# Patient Record
Sex: Male | Born: 1985 | Race: White | Hispanic: No | Marital: Single | Smoking: Former smoker
Health system: Southern US, Community
[De-identification: ages and names within clinical notes are randomized; demographics above are authoritative.]

## PROBLEM LIST (undated history)

## (undated) ENCOUNTER — Ambulatory Visit: Payer: Self-pay

## (undated) DIAGNOSIS — G4730 Sleep apnea, unspecified: Secondary | ICD-10-CM

## (undated) DIAGNOSIS — I10 Essential (primary) hypertension: Secondary | ICD-10-CM

## (undated) DIAGNOSIS — F32A Depression, unspecified: Secondary | ICD-10-CM

## (undated) DIAGNOSIS — J45909 Unspecified asthma, uncomplicated: Secondary | ICD-10-CM

## (undated) HISTORY — DX: Unspecified asthma, uncomplicated: J45.909

## (undated) HISTORY — DX: Sleep apnea, unspecified: G47.30

## (undated) HISTORY — DX: Essential (primary) hypertension: I10

## (undated) HISTORY — PX: FINGER SURGERY: SHX640

---
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
## 2006-09-25 ENCOUNTER — Emergency Department (HOSPITAL_COMMUNITY): Admission: EM | Admit: 2006-09-25 | Discharge: 2006-09-25 | Payer: Self-pay | Admitting: Emergency Medicine

## 2013-06-19 ENCOUNTER — Emergency Department (HOSPITAL_COMMUNITY): Payer: Worker's Compensation

## 2013-06-19 ENCOUNTER — Emergency Department (HOSPITAL_COMMUNITY)
Admission: EM | Admit: 2013-06-19 | Discharge: 2013-06-19 | Disposition: A | Discharge status: Home / Self Care | Payer: Worker's Compensation | Attending: Emergency Medicine | Admitting: Emergency Medicine

## 2013-06-19 ENCOUNTER — Encounter (HOSPITAL_COMMUNITY): Payer: Self-pay | Admitting: Emergency Medicine

## 2013-06-19 DIAGNOSIS — S8990XA Unspecified injury of unspecified lower leg, initial encounter: Secondary | ICD-10-CM | POA: Diagnosis present

## 2013-06-19 DIAGNOSIS — S93409A Sprain of unspecified ligament of unspecified ankle, initial encounter: Secondary | ICD-10-CM | POA: Insufficient documentation

## 2013-06-19 DIAGNOSIS — Y9289 Other specified places as the place of occurrence of the external cause: Secondary | ICD-10-CM | POA: Insufficient documentation

## 2013-06-19 DIAGNOSIS — Y9301 Activity, walking, marching and hiking: Secondary | ICD-10-CM | POA: Insufficient documentation

## 2013-06-19 DIAGNOSIS — Z791 Long term (current) use of non-steroidal anti-inflammatories (NSAID): Secondary | ICD-10-CM | POA: Insufficient documentation

## 2013-06-19 DIAGNOSIS — F17200 Nicotine dependence, unspecified, uncomplicated: Secondary | ICD-10-CM | POA: Diagnosis not present

## 2013-06-19 DIAGNOSIS — S93402A Sprain of unspecified ligament of left ankle, initial encounter: Secondary | ICD-10-CM

## 2013-06-19 DIAGNOSIS — Y990 Civilian activity done for income or pay: Secondary | ICD-10-CM | POA: Diagnosis not present

## 2013-06-19 DIAGNOSIS — X500XXA Overexertion from strenuous movement or load, initial encounter: Secondary | ICD-10-CM | POA: Insufficient documentation

## 2013-06-19 NOTE — Discharge Instructions (Signed)

## 2013-06-19 NOTE — ED Notes (Signed)
Waiting on Ortho Tech to apply ASo ankle brace

## 2013-06-19 NOTE — ED Provider Notes (Signed)
CSN: [id number]     Arrival date & time 06/19/13  0044 History   First MD Initiated Contact with Patient 06/19/13 0101     Chief Complaint  Patient presents with  . Ankle Pain     (Consider location/radiation/quality/duration/timing/severity/associated sxs/prior Treatment) HPI Comments: Patient is an otherwise healthy 28 year old male who presents today after injury to his left ankle. He was walking at work earlier today when he sustained an inversion injury to his left ankle. He states that he was able to catch himself and did not fall to the ground.  No other injuries from the fall. He has never injured this ankle in the past. He has been able to walk on his foot, but only when using only the ball of his foot. He has not been able to take anything for pain, but states he will take aleve when he gets home. He does not want anything for pain currently. No fevers, chills, numbness, weakness.   The history is provided by the patient. No language interpreter was used.    History reviewed. No pertinent past medical history. Past Surgical History  Procedure Laterality Date  . Finger surgery     No family history on file. History  Substance Use Topics  . Smoking status: Current Some Day Smoker  . Smokeless tobacco: Not on file  . Alcohol Use: Yes    Review of Systems  Constitutional: Negative for fever and chills.  Respiratory: Negative for shortness of breath and stridor.   Gastrointestinal: Negative for nausea, vomiting and abdominal pain.  Musculoskeletal: Positive for arthralgias, gait problem and myalgias.  Neurological: Negative for syncope, light-headedness and headaches.  All other systems reviewed and are negative.     Allergies  Review of patient's allergies indicates no known allergies.  Home Medications   Prior to Admission medications   Medication Sig Start Date End Date Taking? Authorizing Provider  naproxen sodium (ANAPROX) 220 MG tablet Take 220 mg by mouth 2  (two) times daily as needed (for pain).   Yes Historical Provider, MD   BP 175/102  Pulse 109  Temp(Src) 98.7 F (37.1 C) (Oral)  Resp 18  Ht 6\' 1"  (1.854 m)  Wt 331 lb (150.141 kg)  BMI 43.68 kg/m2 Physical Exam  Nursing note and vitals reviewed. Constitutional: He is oriented to person, place, and time. He appears well-developed and well-nourished. No distress.  Morbidly obese  HENT:  Head: Normocephalic and atraumatic.  Right Ear: External ear normal.  Left Ear: External ear normal.  Nose: Nose normal.  Eyes: Conjunctivae are normal.  Neck: Normal range of motion. No tracheal deviation present.  Cardiovascular: Normal rate, regular rhythm, normal heart sounds, intact distal pulses and normal pulses.   Pulses:      Dorsalis pedis pulses are 2+ on the right side, and 2+ on the left side.       Posterior tibial pulses are 2+ on the right side, and 2+ on the left side.  Patient is not tachycardic on my exam. Heart rate 90-100.  Pulmonary/Chest: Effort normal and breath sounds normal. No stridor.  Abdominal: Soft. He exhibits no distension. There is no tenderness.  Musculoskeletal: Normal range of motion.  Tender to palpation to medial malleolus. No deformity or bruising. Mild swelling laterally. Sensation intact. Neurovascularly intact. Compartment soft.   Neurological: He is alert and oriented to person, place, and time.  Skin: Skin is warm and dry. He is not diaphoretic.  Psychiatric: He has a normal mood  and affect. His behavior is normal.    ED Course  Procedures (including critical care time) Labs Review Labs Reviewed - No data to display  Imaging Review Dg Ankle Complete Left  06/19/2013   CLINICAL DATA:  Left ankle injury now with left ankle pain and swelling  EXAM: LEFT ANKLE COMPLETE - 3+ VIEW  COMPARISON:  None.  FINDINGS: The ankle joint mortise is preserved. The talar dome is intact. There is no evidence of an acute malleolar fracture. There are Achilles and  plantar calcaneal spurs. The talus and calcaneus appear intact. There is mild diffuse soft tissue swelling.  IMPRESSION: There is no acute bony abnormality of the left ankle. There is soft tissue swelling.   Electronically Signed   By: David  Swazil[place]   On: 06/19/2013 01:57     EKG Interpretation None      MDM   Final diagnoses:  Left ankle sprain    Imaging shows no fracture. Directed pt to ice injury, take acetaminophen or ibuprofen for pain, and to elevate and rest the injury when possible. Patient given crutches for comfort. Return instructions given. Vital signs stable for discharge. Patient / Family / Caregiver informed of clinical course, understand medical decision-making process, and agree with plan.   Mora BellmanHannah S Keiondre Colee, PA-C 06/20/13 1840

## 2013-06-19 NOTE — ED Notes (Signed)
Pt at work, turned a corner and rolled left ankle outward. Pt did not fall down. Pt's left lateral ankle swollen, painful to touch.

## 2013-06-19 NOTE — ED Notes (Signed)
Pt. injured his left ankle while walking at work this evening , " rolled" ankle while turning , ambulatory .

## 2013-06-21 NOTE — ED Provider Notes (Signed)
Medical screening examination/treatment/procedure(s) were performed by non-physician practitioner and as supervising physician I was immediately available for consultation/collaboration.   EKG Interpretation None        Joya Gaskinsonald W Joya Willmott, MD 06/21/13 [id number]

## 2014-12-18 IMAGING — CR DG ANKLE COMPLETE 3+V*L*
3 series · 3 of 3 positions shown · non-contrast
Comparison: None.

CLINICAL DATA: Left ankle injury now with left ankle pain and
swelling

EXAM:
LEFT ANKLE COMPLETE - 3+ VIEW

[t ankle joint ap left]
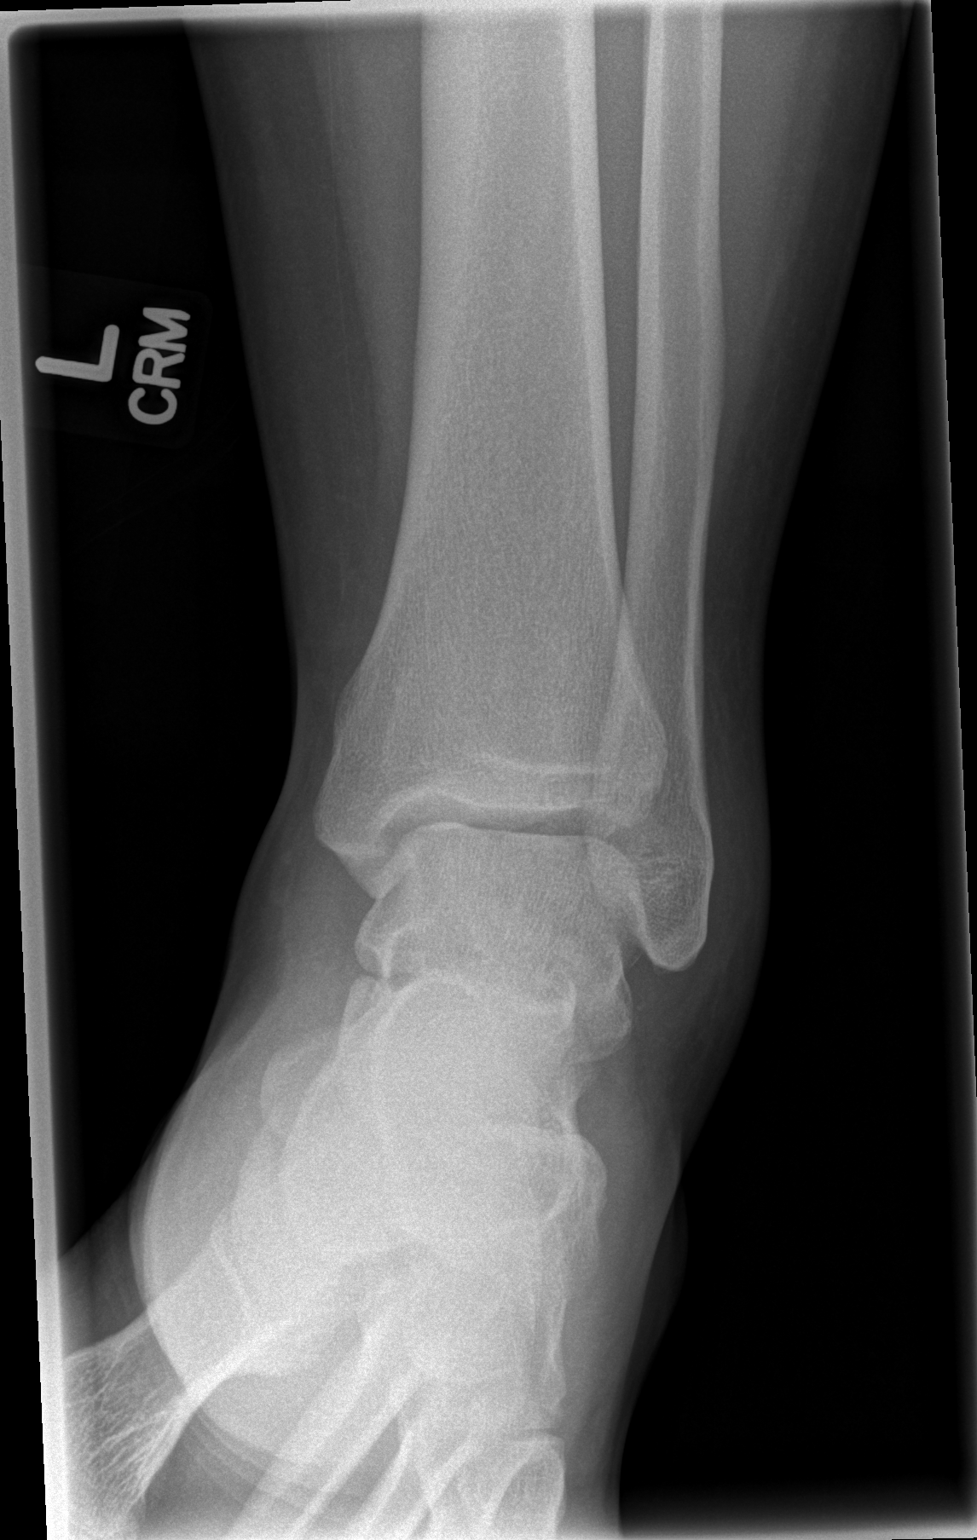

[t ankle joint oblique left *]
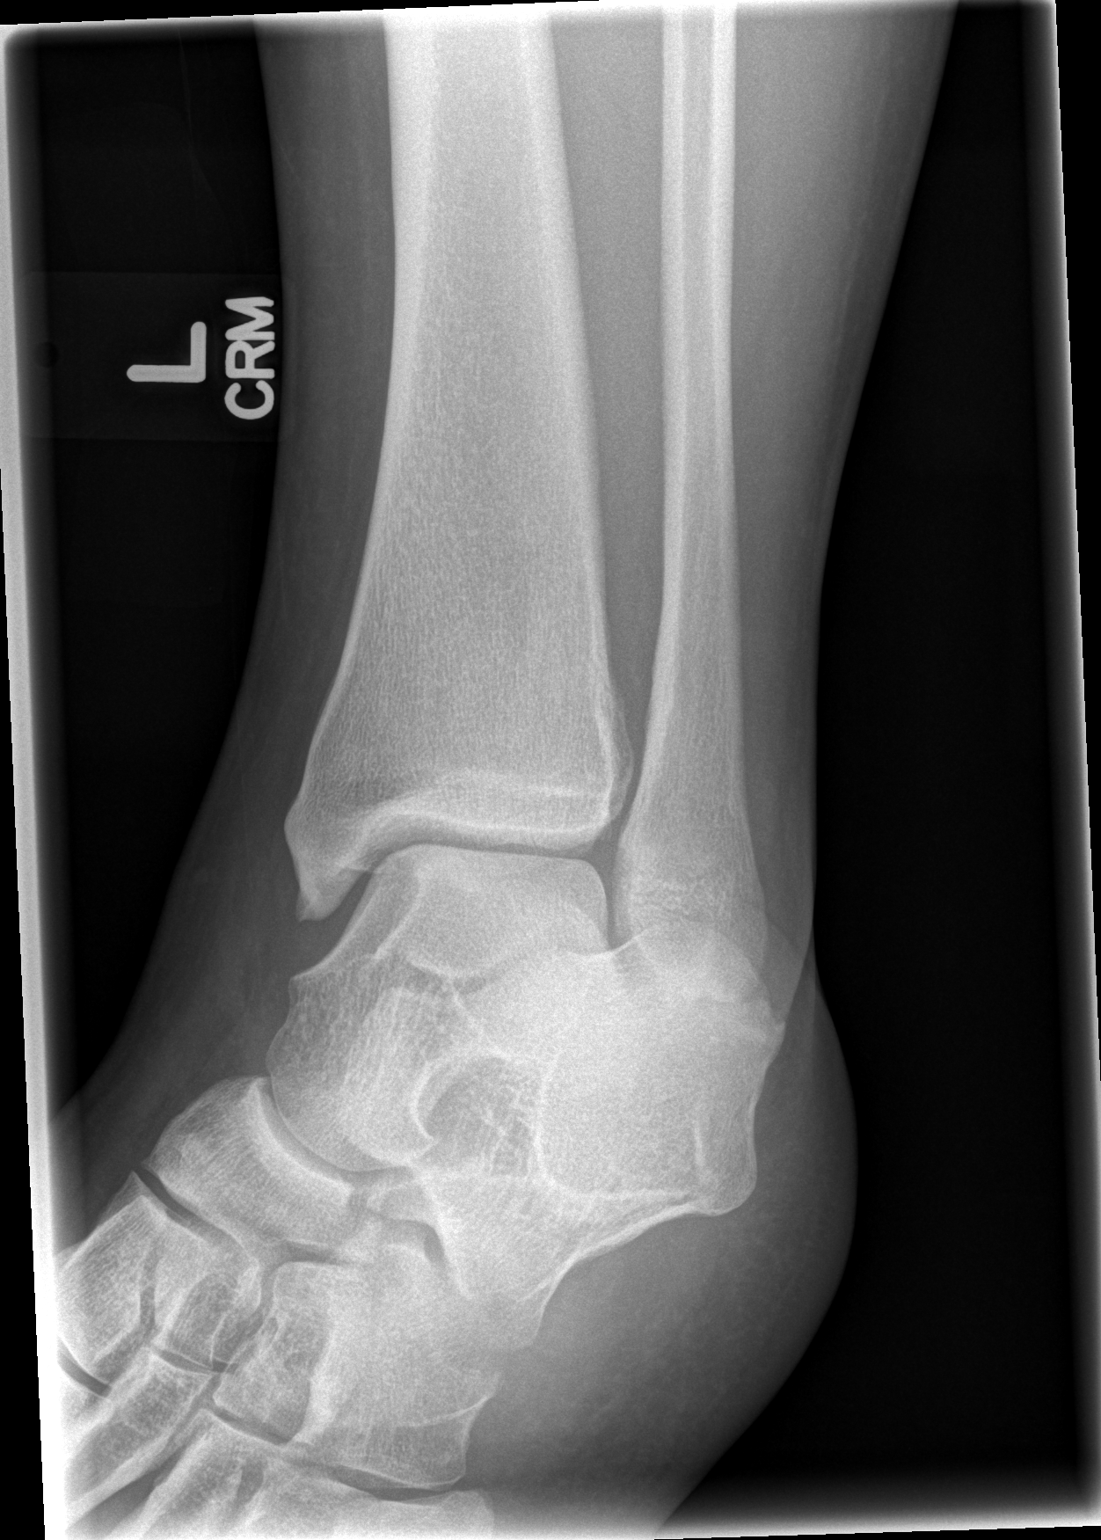

[t ankle joint lat left]
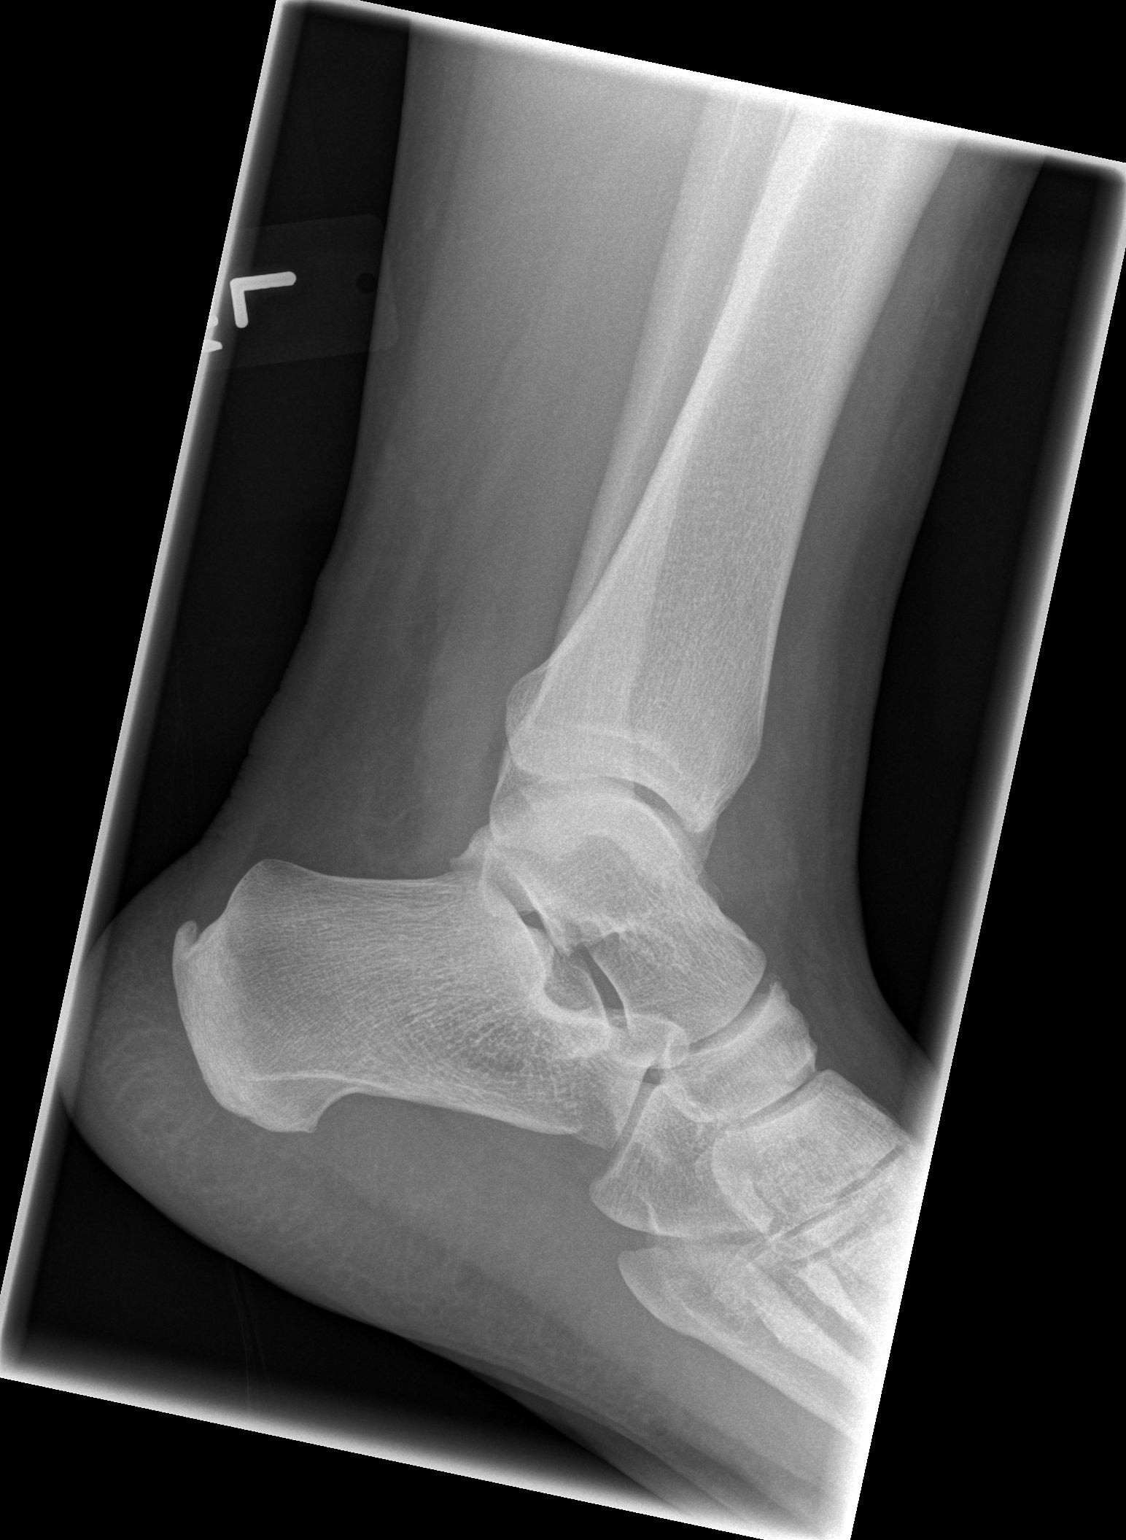

[3 of 3 positions shown; findings below may reference images not displayed]

FINDINGS: The ankle joint mortise is preserved. The talar dome is intact.
There is no evidence of an acute malleolar fracture. There are
Achilles and plantar calcaneal spurs. The talus and calcaneus appear
intact. There is mild diffuse soft tissue swelling.
IMPRESSION: There is no acute bony abnormality of the left ankle. There is soft
tissue swelling.

## 2016-06-28 ENCOUNTER — Ambulatory Visit (INDEPENDENT_AMBULATORY_CARE_PROVIDER_SITE_OTHER): Payer: BLUE CROSS/BLUE SHIELD | Admitting: Urology

## 2016-06-28 DIAGNOSIS — N3944 Nocturnal enuresis: Secondary | ICD-10-CM

## 2016-09-20 ENCOUNTER — Encounter: Payer: Self-pay | Admitting: Pulmonary Disease

## 2016-09-20 ENCOUNTER — Ambulatory Visit (INDEPENDENT_AMBULATORY_CARE_PROVIDER_SITE_OTHER): Payer: BLUE CROSS/BLUE SHIELD | Admitting: Pulmonary Disease

## 2016-09-20 DIAGNOSIS — N3944 Nocturnal enuresis: Secondary | ICD-10-CM | POA: Diagnosis not present

## 2016-09-20 DIAGNOSIS — G4733 Obstructive sleep apnea (adult) (pediatric): Secondary | ICD-10-CM | POA: Insufficient documentation

## 2016-09-20 NOTE — Progress Notes (Signed)
   Subjective:    Patient ID: Stephen Cooper, male    DOB: 08-Jan-1986, [age] y.o.   MRN: [id number]  HPI    Review of Systems  Constitutional: Negative for fever and unexpected weight change.  HENT: Negative for congestion, dental problem, ear pain, nosebleeds, postnasal drip, rhinorrhea, sinus pressure, sneezing, sore throat and trouble swallowing.   Eyes: Negative for redness and itching.  Respiratory: Negative for cough, chest tightness, shortness of breath and wheezing.   Cardiovascular: Negative for palpitations and leg swelling.  Gastrointestinal: Negative for nausea and vomiting.  Genitourinary: Negative for dysuria.  Musculoskeletal: Negative for joint swelling.  Skin: Negative for rash.  Allergic/Immunologic: Negative.  Negative for environmental allergies, food allergies and immunocompromised state.  Neurological: Positive for headaches.  Hematological: Does not bruise/bleed easily.  Psychiatric/Behavioral: Positive for dysphoric mood and sleep disturbance. The patient is nervous/anxious.        Objective:   Physical Exam        Assessment & Plan:

## 2016-09-20 NOTE — Assessment & Plan Note (Signed)
Given excessive daytime somnolence, narrow pharyngeal exam, witnessed apneas & loud snoring, obstructive sleep apnea is very likely & an overnight polysomnogram will be scheduled as a home study. The pathophysiology of obstructive sleep apnea , it's cardiovascular consequences & modes of treatment including CPAP were discused with the patient in detail & they evidenced understanding.  Pretest probability is high 

## 2016-09-20 NOTE — Patient Instructions (Signed)
Home sleep study. Based on this, he may need a CPAP titration study
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
## 2016-09-29 DIAGNOSIS — G4733 Obstructive sleep apnea (adult) (pediatric): Secondary | ICD-10-CM | POA: Diagnosis not present

## 2016-09-30 ENCOUNTER — Other Ambulatory Visit: Payer: Self-pay | Admitting: *Deleted

## 2016-09-30 ENCOUNTER — Telehealth: Payer: Self-pay | Admitting: Pulmonary Disease

## 2016-09-30 DIAGNOSIS — N3944 Nocturnal enuresis: Secondary | ICD-10-CM

## 2016-09-30 DIAGNOSIS — G4733 Obstructive sleep apnea (adult) (pediatric): Secondary | ICD-10-CM

## 2016-09-30 NOTE — Telephone Encounter (Signed)
Per RA, HST showed moderate OSA with 27 events per hour.   Recommends a CPAP titration study as the next step.

## 2016-10-04 NOTE — Telephone Encounter (Signed)
Spoke with pt letting him know the results from his HST and that RA recommended him having a CPAP titration study. Pt expressed understanding. Placed an order for him to have a CPAP titration study performed. Nothing further needed at this time.

## 2016-10-11 ENCOUNTER — Ambulatory Visit (INDEPENDENT_AMBULATORY_CARE_PROVIDER_SITE_OTHER): Payer: BLUE CROSS/BLUE SHIELD | Admitting: Urology

## 2016-10-11 DIAGNOSIS — R0683 Snoring: Secondary | ICD-10-CM | POA: Diagnosis not present

## 2016-10-11 DIAGNOSIS — N3944 Nocturnal enuresis: Secondary | ICD-10-CM

## 2016-11-01 ENCOUNTER — Ambulatory Visit: Payer: BLUE CROSS/BLUE SHIELD | Attending: Pulmonary Disease | Admitting: Pulmonary Disease

## 2016-11-01 ENCOUNTER — Encounter (INDEPENDENT_AMBULATORY_CARE_PROVIDER_SITE_OTHER): Payer: Self-pay

## 2016-11-01 DIAGNOSIS — G4733 Obstructive sleep apnea (adult) (pediatric): Secondary | ICD-10-CM | POA: Diagnosis present

## 2016-11-08 ENCOUNTER — Telehealth: Payer: Self-pay | Admitting: Pulmonary Disease

## 2016-11-08 DIAGNOSIS — G4733 Obstructive sleep apnea (adult) (pediatric): Secondary | ICD-10-CM | POA: Diagnosis not present

## 2016-11-08 NOTE — Telephone Encounter (Signed)
Pl elt pt know Rx to DME for CPAP  11 cm H2O with a Small size Resmed Nasal Mask Airfit N20 mask and heated humidification. DL in 4 wks  OV in 6 wks

## 2016-11-08 NOTE — Procedures (Signed)
Patient Name: Stephen Cooper, Stephen Cooper Date: 11/01/2016 Gender: Male D.O.B: 20-Apr-1985 Age (years): 30 Referring Provider: Cyril Mourning MD, ABSM Height (inches): 73 Interpreting Physician: Cyril Mourning MD, ABSM Weight (lbs): 382 RPSGT: Alfonso Ellis BMI: 50 MRN: [id number] Neck Size: 21.50   CLINICAL INFORMATION The patient is referred for a CPAP titration to treat sleep apnea.  Date of HST: 09/2016, AHI 27/h  SLEEP STUDY TECHNIQUE As per the AASM Manual for the Scoring of Sleep and Associated Events v2.3 (April 2016) with a hypopnea requiring 4% desaturations.  The channels recorded and monitored were frontal, central and occipital EEG, electrooculogram (EOG), submentalis EMG (chin), nasal and oral airflow, thoracic and abdominal wall motion, anterior tibialis EMG, snore microphone, electrocardiogram, and pulse oximetry. Continuous positive airway pressure (CPAP) was initiated at the beginning of the study and titrated to treat sleep-disordered breathing.  MEDICATIONS Medications self-administered by patient taken the night of the study : DESMOPRESSIN  TECHNICIAN COMMENTS Comments added by technician: Patient tolerated CPAP very well. CPAP titration started at 5 cm of H20 and increased to 11 cm of H20 due to events and snoring. No bathroom trips noted. Patient had some difficulities initiating sleep. Optimal pressure seemed to be 11 cm of H20   RESPIRATORY PARAMETERS Optimal PAP Pressure (cm): 11 AHI at Optimal Pressure (/hr): 0.0 Overall Minimal O2 (%): 82.00 Supine % at Optimal Pressure (%): 94 Minimal O2 at Optimal Pressure (%): 90.0     SLEEP ARCHITECTURE The study was initiated at 11:07:33 PM and ended at 5:39:07 AM.  Sleep onset time was 61.2 minutes and the sleep efficiency was 81.3%. The total sleep time was 318.3 minutes.  The patient spent 0.16% of the night in stage N1 sleep, 24.60% in stage N2 sleep, 49.48% in stage N3 and 25.76% in REM.Stage REM latency was 48.0  minutes  Wake after sleep onset was 12.0. Alpha intrusion was absent. Supine sleep was 59.45%.  CARDIAC DATA The 2 lead EKG demonstrated sinus rhythm. The mean heart rate was N/A beats per minute. Other EKG findings include: None.   LEG MOVEMENT DATA The total Periodic Limb Movements of Sleep (PLMS) were 0. The PLMS index was 0.00. A PLMS index of <15 is considered normal in adults.  IMPRESSIONS - The optimal PAP pressure was 11 cm of water. - Central sleep apnea was not noted during this titration (CAI = 0.0/h). - Moderate oxygen desaturations were observed during this titration (min O2 = 82.00%). - The patient snored with Moderate snoring volume during this titration study. - No cardiac abnormalities were observed during this study. - Clinically significant periodic limb movements were not noted during this study. Arousals associated with PLMs were rare.   DIAGNOSIS - Obstructive Sleep Apnea (327.23 [G47.33 ICD-10])   RECOMMENDATIONS - Trial of CPAP therapy on 11 cm H2O with a Small size Resmed Nasal Mask Airfit N20 mask and heated humidification. - Avoid alcohol, sedatives and other CNS depressants that may worsen sleep apnea and disrupt normal sleep architecture. - Sleep hygiene should be reviewed to assess factors that may improve sleep quality. - Weight management and regular exercise should be initiated or continued. - Return to Sleep Center for re-evaluation after 4 weeks of therapy    Cyril Mourning MD Board Certified in Sleep medicine

## 2016-11-10 NOTE — Telephone Encounter (Signed)
Spoke with patient. He is aware of the CPAP order. Will go ahead and place today. Nothing further needed at time of call.

## 2019-10-01 ENCOUNTER — Encounter: Payer: Self-pay | Admitting: Emergency Medicine

## 2019-10-01 ENCOUNTER — Ambulatory Visit
Admission: EM | Admit: 2019-10-01 | Discharge: 2019-10-01 | Disposition: A | Discharge status: Home / Self Care | Payer: BC Managed Care – PPO | Attending: Emergency Medicine | Admitting: Emergency Medicine

## 2019-10-01 DIAGNOSIS — M545 Low back pain, unspecified: Secondary | ICD-10-CM

## 2019-10-01 MED ORDER — CYCLOBENZAPRINE HCL 5 MG PO TABS: 5.0000 mg | ORAL_TABLET | Freq: Three times a day (TID) | ORAL | 0 refills | Status: DC | PRN

## 2019-10-01 MED ORDER — DEXAMETHASONE SODIUM PHOSPHATE 10 MG/ML IJ SOLN
10.0000 mg | Freq: Once | INTRAMUSCULAR | Status: AC
  Administered 2019-10-01: 10 mg via INTRAMUSCULAR

## 2019-10-01 MED ORDER — ACETAMINOPHEN 500 MG PO TABS: 500.0000 mg | ORAL_TABLET | Freq: Four times a day (QID) | ORAL | 0 refills | Status: DC | PRN

## 2019-10-01 MED ORDER — PREDNISONE 10 MG (21) PO TBPK: ORAL_TABLET | ORAL | 0 refills | Status: DC

## 2019-10-01 MED ORDER — KETOROLAC TROMETHAMINE 30 MG/ML IJ SOLN
30.0000 mg | Freq: Once | INTRAMUSCULAR | Status: AC
  Administered 2019-10-01: 30 mg via INTRAMUSCULAR

## 2019-10-01 NOTE — Discharge Instructions (Addendum)
Rest, ice and heat as needed Ensure adequate ROM as tolerated. Prescribed Tylenol as needed for  pain relief Prescribed flexeril  for muscle spasm.  Do not drive or operate heavy machinery while taking this medication Prescribed prednisone Return here or go to ER if you have any new or worsening symptoms such as numbness/tingling of the inner thighs, loss of bladder or bowel control, headache/blurry vision, nausea/vomiting, confusion/altered mental status, dizziness, weakness, passing out, imbalance, etc..Marland Kitchen

## 2019-10-01 NOTE — ED Provider Notes (Signed)
[facility]   [id number] 10/01/19 Arrival Time: 0809   Chief Complaint  Patient presents with  . Back Pain     SUBJECTIVE: History from: patient.  Stephen Cooper is a 34 y.o. male who presented to the urgent care with a complaint of low back pain for the past 2 days.  Denies any precipitating event, injury or trauma.  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  He localizes the pain to the right low back.  He describes the pain as constant and achy.  Has not tried any OTC medication.  His symptoms are made worse with ROM.  He denies similar symptoms in the past.  Denies chills, fever, nausea, vomiting, diarrhea, paresthesia, diplopia, blurry vision, LOC.   ROS: As per HPI.  All other pertinent ROS negative.     Past Medical History:  Diagnosis Date  . 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 Hypertension   . Sleep apnea    Past Surgical History:  Procedure Laterality Date  . FINGER SURGERY     No Known Allergies No current facility-administered medications on file prior to encounter.   Current Outpatient Medications on File Prior to Encounter  Medication Sig Dispense Refill  . desmopressin (DDAVP) 0.2 MG tablet Take 0.2 mg by mouth daily. Pt takes 4 tabs daily.    . naproxen sodium (ANAPROX) 220 MG tablet Take 220 mg by mouth 2 (two) times daily as needed (for pain).     Social History   Socioeconomic History  . Marital status: Single    Spouse name: Not on file  . Number of children: Not on file  . Years of education: Not on file  . Highest education level: Not on file  Occupational History  . Not on file  Tobacco Use  . Smoking status: Former Games developer  . Smokeless tobacco: Current User    Types: Snuff  . Tobacco comment: pt smokes Hookah once a week  Vaping Use  . Vaping Use: Never used  Substance and Sexual Activity  . Alcohol use: Not Currently    Comment: occasional liquor  . Drug use: No  . Sexual activity: Not on  file  Other Topics Concern  . Not on file  Social History Narrative  . Not on file   Social Determinants of Health   Financial Resource Strain:   . Difficulty of Paying Living Expenses:   Food Insecurity:   . Worried About Programme researcher, broadcasting/film/video in the Last Year:   . Barista in the Last Year:   Transportation Needs:   . Freight forwarder (Medical):   Marland Kitchen Lack of Transportation (Non-Medical):   Physical Activity:   . Days of Exercise per Week:   . Minutes of Exercise per Session:   Stress:   . Feeling of Stress :   Social Connections:   . Frequency of Communication with Friends and Family:   . Frequency of Social Gatherings with Friends and Family:   . Attends Religious Services:   . Active Member of Clubs or Organizations:   . Attends Banker Meetings:   Marland Kitchen Marital Status:   Intimate Partner Violence:   . Fear of Current or Ex-Partner:   . Emotionally Abused:   Marland Kitchen Physically Abused:   . Sexually Abused:    No family history on file.  OBJECTIVE:  Vitals:   10/01/19 0822 10/01/19 0825  BP:  Marland Kitchen)  155/103  Pulse:  88  Resp:  19  Temp:  98.4 F (36.9 C)  TempSrc:  Oral  SpO2:  96%  Weight: (!) 388 lb 0.2 oz (176 kg)   Height: 6\' 1"  (1.854 m)      Physical Exam Vitals and nursing note reviewed.  Constitutional:      General: He is not in acute distress.    Appearance: Normal appearance. He is normal weight. He is not ill-appearing, toxic-appearing or diaphoretic.  Cardiovascular:     Rate and Rhythm: Normal rate and regular rhythm.     Pulses: Normal pulses.     Heart sounds: Normal heart sounds. No murmur heard.  No friction rub. No gallop.   Pulmonary:     Effort: Pulmonary effort is normal. No respiratory distress.     Breath sounds: Normal breath sounds. No stridor. No wheezing, rhonchi or rales.  Chest:     Chest wall: No tenderness.  Musculoskeletal:     Cervical back: Normal.     Thoracic back: Normal.     Lumbar back: Spasms and  tenderness present.     Comments: Back:  Patient ambulates from chair to exam table without difficulty.  Inspection: Skin clear and intact without obvious swelling, erythema, or ecchymosis. Warm to the touch  Palpation: Vertebral processes nontender. Tenderness about the lower left paravertebral muscles  ROM: FROM Strength: 5/5 hip flexion, 5/5 knee extension, 5/5 knee flexion, 5/5 plantar flexion, 5/5 dorsiflexion   Special Tests: Negative Straight leg raise  Neurological:     General: No focal deficit present.     Mental Status: He is alert and oriented to person, place, and time.     GCS: GCS eye subscore is 4. GCS verbal subscore is 5. GCS motor subscore is 6.     Cranial Nerves: Cranial nerves are intact.     Sensory: Sensation is intact.     Motor: Motor function is intact.     Coordination: Coordination is intact.     Gait: Gait is intact.     LABS:  No results found for this or any previous visit (from the past 24 hour(s)).   ASSESSMENT & PLAN:  1. Acute right-sided low back pain without sciatica     Meds ordered this encounter  Medications  . dexamethasone (DECADRON) injection 10 mg  . ketorolac (TORADOL) 30 MG/ML injection 30 mg  . predniSONE (STERAPRED UNI-PAK 21 TAB) 10 MG (21) TBPK tablet    Sig: Take 6 tabs by mouth daily  for 1 days, then 5 tabs for 1 days, then 4 tabs for 1 days, then 3 tabs for 1 days, 2 tabs for 1 days, then 1 tab by mouth daily for 1 days    Dispense:  21 tablet    Refill:  0  . cyclobenzaprine (FLEXERIL) 5 MG tablet    Sig: Take 1 tablet (5 mg total) by mouth 3 (three) times daily as needed.    Dispense:  30 tablet    Refill:  0  . acetaminophen (TYLENOL) 500 MG tablet    Sig: Take 1 tablet (500 mg total) by mouth every 6 (six) hours as needed.    Dispense:  30 tablet    Refill:  0   Discharge Instructions Rest, ice and heat as needed Ensure adequate ROM as tolerated. Prescribed Tylenol as needed for  pain relief Prescribed  flexeril  for muscle spasm.  Do not drive or operate heavy machinery while taking this medication Prescribed  prednisone Return here or go to ER if you have any new or worsening symptoms such as numbness/tingling of the inner thighs, loss of bladder or bowel control, headache/blurry vision, nausea/vomiting, confusion/altered mental status, dizziness, weakness, passing out, imbalance, etc...    Reviewed expectations re: course of current medical issues. Questions answered. Outlined signs and symptoms indicating need for more acute intervention. Patient verbalized understanding. After Visit Summary given.      Note: This document was prepared using Dragon voice recognition software and may include unintentional dictation errors.    Durward Parcel, FNP 10/01/19 [phone]

## 2019-10-01 NOTE — ED Triage Notes (Signed)
Low back pain with movement x 2 days.  Denies any injury.  Pt states he has had to call out of work.

## 2021-03-30 ENCOUNTER — Ambulatory Visit
Admission: EM | Admit: 2021-03-30 | Discharge: 2021-03-30 | Disposition: A | Discharge status: Home / Self Care | Payer: BC Managed Care – PPO | Attending: Family Medicine | Admitting: Family Medicine

## 2021-03-30 ENCOUNTER — Other Ambulatory Visit: Payer: Self-pay

## 2021-03-30 DIAGNOSIS — R509 Fever, unspecified: Secondary | ICD-10-CM

## 2021-03-30 DIAGNOSIS — R6883 Chills (without fever): Secondary | ICD-10-CM

## 2021-03-30 DIAGNOSIS — J069 Acute upper respiratory infection, unspecified: Secondary | ICD-10-CM

## 2021-03-30 MED ORDER — OSELTAMIVIR PHOSPHATE 75 MG PO CAPS: 75.0000 mg | ORAL_CAPSULE | Freq: Two times a day (BID) | ORAL | 0 refills | Status: DC

## 2021-03-30 MED ORDER — PROMETHAZINE-DM 6.25-15 MG/5ML PO SYRP: 5.0000 mL | ORAL_SOLUTION | Freq: Four times a day (QID) | ORAL | 0 refills | Status: DC | PRN

## 2021-03-30 NOTE — ED Triage Notes (Signed)
Pt tested + for covid two weeks ago. He took a covid tested two days ago and it was negative.

## 2021-03-30 NOTE — ED Triage Notes (Signed)
Pt presents to the office for fatigue,headache and body ache x 2 days.

## 2021-03-30 NOTE — ED Provider Notes (Signed)
[facility]    CSN: [id number] Arrival date & time: 03/30/21  0947      History   Chief Complaint Chief Complaint  Patient presents with   Fatigue   Headache    Body aches x 3 dyas     HPI Stephen Cooper is a 36 y.o. male.   Presenting today with 2-day history of fatigue, headaches, body aches, chills, sweats, cough, congestion, sore throat, abdominal pain and 1 episode of nausea and vomiting that has now resolved.  Denies chest pain, shortness of breath, dizziness, intolerance to p.o.  So far taking Mucinex, over-the-counter cold and congestion medications with no relief.  Recently resolved from COVID 2 weeks ago.  History of childhood asthma, has not had an inhaler at home for years per patient.   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 Authorizing Provider  oseltamivir (TAMIFLU) 75 MG capsule Take 1 capsule (75 mg total) by mouth every 12 (twelve) hours. 03/30/21  Yes Particia Nearing, PA-C  promethazine-dextromethorphan (PROMETHAZINE-DM) 6.25-15 MG/5ML syrup Take 5 mLs by mouth 4 (four) times daily as needed. 03/30/21  Yes Particia Nearing, PA-C  acetaminophen (TYLENOL) 500 MG tablet Take 1 tablet (500 mg total) by mouth every 6 (six) hours as needed. 10/01/19   Avegno, Zachery Dakins, FNP  cyclobenzaprine (FLEXERIL) 5 MG tablet Take 1 tablet (5 mg total) by mouth 3 (three) times daily as needed. 10/01/19   Avegno, Zachery Dakins, FNP  desmopressin (DDAVP) 0.2 MG tablet Take 0.2 mg by mouth daily. Pt takes 4 tabs daily.    [provider]  naproxen sodium  (ANAPROX) 220 MG tablet Take 220 mg by mouth 2 (two) times daily as needed (for pain).    [provider]  predniSONE (STERAPRED UNI-PAK 21 TAB) 10 MG (21) TBPK tablet Take 6 tabs by mouth daily  for 1 days, then 5 tabs for 1 days, then 4 tabs for 1 days, then 3 tabs for 1 days, 2 tabs for 1 days, then 1 tab by mouth daily for 1 days 10/01/19   Durward Parcel, FNP    Family History History reviewed. No pertinent family history.  Social History Social History   Tobacco Use   Smoking status: Former   Smokeless tobacco: Current    Types: Snuff   Tobacco comments:    pt smokes Hookah once a week  Vaping Use   Vaping Use: Never used  Substance Use Topics   Alcohol use: Not Currently    Comment: occasional liquor   Drug use: No     Allergies   Patient has no known allergies.   Review of Systems Review of Systems Per HPI  Physical Exam Triage Vital Signs ED Triage Vitals [03/30/21 1008]  Enc Vitals Group     BP (!) 135/91     Pulse Rate 91     Resp 16     Temp 98.7 F (37.1 C)     Temp Source Oral  SpO2 100 %     Weight      Height      Head Circumference      Peak Flow      Pain Score      Pain Loc      Pain Edu?      Excl. in GC?    No data found.  Updated Vital Signs BP (!) 135/91 (BP Location: Left Arm)    Pulse 91    Temp 98.7 F (37.1 C) (Oral)    Resp 16    SpO2 100%   Visual Acuity Right Eye Distance:   Left Eye Distance:   Bilateral Distance:    Right Eye Near:   Left Eye Near:    Bilateral Near:     Physical Exam Vitals and nursing note reviewed.  Constitutional:      General: He is not in acute distress.    Appearance: He is well-developed. He is diaphoretic.  HENT:     Head: Atraumatic.     Right Ear: Tympanic membrane and external ear normal.     Left Ear: Tympanic membrane and external ear normal.     Nose: Rhinorrhea present.     Mouth/Throat:     Pharynx: Posterior oropharyngeal erythema present. No oropharyngeal  exudate.  Eyes:     Conjunctiva/sclera: Conjunctivae normal.     Pupils: Pupils are equal, round, and reactive to light.  Cardiovascular:     Rate and Rhythm: Normal rate and regular rhythm.     Heart sounds: Normal heart sounds.  Pulmonary:     Effort: Pulmonary effort is normal. No respiratory distress.     Breath sounds: No wheezing or rales.  Abdominal:     General: Bowel sounds are normal. There is no distension.     Palpations: Abdomen is soft.     Tenderness: There is no abdominal tenderness. There is no guarding.  Musculoskeletal:        General: Normal range of motion.     Cervical back: Normal range of motion and neck supple.  Lymphadenopathy:     Cervical: No cervical adenopathy.  Skin:    General: Skin is warm.  Neurological:     Mental Status: He is alert and oriented to person, place, and time.  Psychiatric:        Behavior: Behavior normal.     UC Treatments / Results  Labs (all labs ordered are listed, but only abnormal results are displayed) Labs Reviewed  COVID-19, FLU A+B NAA    EKG   Radiology No results found.  Procedures Procedures (including critical care time)  Medications Ordered in UC Medications - No data to display  Initial Impression / Assessment and Plan / UC Course  I have reviewed the triage vital signs and the nursing notes.  Pertinent labs & imaging results that were available during my care of the patient were reviewed by me and considered in my medical decision making (see chart for details).     Strongly suspect influenza causing his symptoms, COVID and flu testing pending, will start Tamiflu proactively while awaiting results and Phenergan DM for symptomatic benefit.  Discussed over-the-counter supportive medications and home care additionally.  Strict return precautions given for any acutely worsening symptoms.  Final Clinical Impressions(s) / UC Diagnoses   Final diagnoses:  Viral URI with cough  Fever, unspecified   Chills   Discharge Instructions   None    ED Prescriptions     Medication Sig Dispense  Auth. Provider   oseltamivir (TAMIFLU) 75 MG capsule Take 1 capsule (75 mg total) by mouth every 12 (twelve) hours. 10 capsule Particia Nearing, [place]   promethazine-dextromethorphan (PROMETHAZINE-DM) 6.25-15 MG/5ML syrup Take 5 mLs by mouth 4 (four) times daily as needed. 100 mL Particia Nearing, [place]      PDMP not reviewed this encounter.   Particia Nearing, [place] 03/30/21 1114

## 2021-03-31 LAB — COVID-19, FLU A+B NAA
Influenza A, NAA: DETECTED — AB
Influenza B, NAA: NOT DETECTED
SARS-CoV-2, NAA: NOT DETECTED

## 2021-04-01 ENCOUNTER — Telehealth (HOSPITAL_COMMUNITY): Payer: Self-pay | Admitting: Emergency Medicine

## 2021-04-01 MED ORDER — ALBUTEROL SULFATE HFA 108 (90 BASE) MCG/ACT IN AERS: 2.0000 | INHALATION_SPRAY | RESPIRATORY_TRACT | 0 refills | Status: DC | PRN

## 2023-03-27 ENCOUNTER — Ambulatory Visit
Admission: EM | Admit: 2023-03-27 | Discharge: 2023-03-27 | Disposition: A | Discharge status: Home / Self Care | Payer: Self-pay | Attending: Physician Assistant | Admitting: Physician Assistant

## 2023-03-27 DIAGNOSIS — F430 Acute stress reaction: Secondary | ICD-10-CM

## 2023-03-27 DIAGNOSIS — G4700 Insomnia, unspecified: Secondary | ICD-10-CM

## 2023-03-27 MED ORDER — HYDROXYZINE HCL 25 MG PO TABS: 25.0000 mg | ORAL_TABLET | Freq: Three times a day (TID) | ORAL | 0 refills | Status: DC | PRN

## 2023-03-27 NOTE — ED Provider Notes (Signed)
[facility]    CSN: [id number] Arrival date & time: 03/27/23  1337      History   Chief Complaint No chief complaint on file.   HPI Stephen Cooper is a 38 y.o. male.   Patient presents today with a prolonged history of situational stress and anxiety that has worsened in the past several days.  He reports that overnight he was unable to sleep because he had trouble controlling his anxiety and thoughts.  He has never seen a mental health provider and denies formal diagnosis of depression or anxiety.  He reports many situational stressors including stressors at work as well as at home.  He has never been hospitalized for mental health in the past and has never tried medication.  He does not turn on night but did take melatonin and this was ineffective.  He reports that physically he is feeling very weak as a result of the lack of sleep and is hoping to use the medication to help him get some rest.  He has had passive suicidal ideation in the past but denies any previous suicide attempt.  He does not currently have any thoughts of suicide or harming anyone else but does often just feel like he is better off not being here.  He has had panic attacks in the past that are generally triggered by dread but has not had any of these episodes recently.  He does not have any access to weapons and reports a very strong support network including a significant other with mental health issues who is very supportive.      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 Authorizing Provider  hydrOXYzine  (ATARAX) 25 MG tablet Take 1 tablet (25 mg total) by mouth every 8 (eight) hours as needed. 03/27/23  Yes Kendry Pfarr, Noberto Retort, PA-C  acetaminophen (TYLENOL) 500 MG tablet Take 1 tablet (500 mg total) by mouth every 6 (six) hours as needed. 10/01/19   Avegno, Zachery Dakins, FNP  albuterol (VENTOLIN HFA) 108 (90 Base) MCG/ACT inhaler Inhale 2 puffs into the lungs every 4 (four) hours as needed for wheezing or shortness of breath. 04/01/21   Merrilee Jansky, MD  desmopressin (DDAVP) 0.2 MG tablet Take 0.2 mg by mouth Cooper. Pt takes 4 tabs Cooper.    [provider]    Family History History reviewed. No pertinent family history.  Social History Social History   Tobacco Use   Smoking status: Former   Smokeless tobacco: Current    Types: Snuff   Tobacco comments:    pt smokes Hookah once a week  Vaping Use   Vaping status: Never Used  Substance Use Topics   Alcohol use: Not Currently    Comment: occasional liquor   Drug use: No     Allergies   Patient has no known allergies.   Review of Systems Review of Systems  Constitutional:  Negative for activity change, appetite change, fatigue and fever.  Respiratory:  Negative for shortness  of breath.   Cardiovascular:  Negative for chest pain and palpitations.  Psychiatric/Behavioral:  Positive for sleep disturbance. Negative for agitation, confusion, dysphoric mood, hallucinations, self-injury and suicidal ideas (history of passive). The patient is nervous/anxious. The patient is not hyperactive.      Physical Exam Triage Vital Signs ED Triage Vitals  Encounter Vitals Group     BP 03/27/23 1518 (!) 145/101     Systolic BP Percentile --      Diastolic BP Percentile --      Pulse Rate 03/27/23 1518 68     Resp 03/27/23 1518 18     Temp 03/27/23 1518 98.7 F (37.1 C)     Temp Source 03/27/23 1518 Oral     SpO2 03/27/23 1518 96 %     Weight --      Height --      Head Circumference --      Peak Flow --      Pain Score 03/27/23  1520 0     Pain Loc --      Pain Education --      Exclude from Growth Chart --    No data found.  Updated Vital Signs BP (!) 145/101 (BP Location: Right Arm)   Pulse 68   Temp 98.7 F (37.1 C) (Oral)   Resp 18   SpO2 96%   Visual Acuity Right Eye Distance:   Left Eye Distance:   Bilateral Distance:    Right Eye Near:   Left Eye Near:    Bilateral Near:     Physical Exam Vitals reviewed.  Constitutional:      General: He is awake.     Appearance: Normal appearance. He is well-developed. He is not ill-appearing.     Comments: Very pleasant male appears stated age in no acute distress sitting comfortably in exam room  HENT:     Head: Normocephalic and atraumatic.     Mouth/Throat:     Pharynx: No oropharyngeal exudate, posterior oropharyngeal erythema or uvula swelling.  Cardiovascular:     Rate and Rhythm: Normal rate and regular rhythm.     Heart sounds: Normal heart sounds, S1 normal and S2 normal. No murmur heard. Pulmonary:     Effort: Pulmonary effort is normal.     Breath sounds: Normal breath sounds. No stridor. No wheezing, rhonchi or rales.     Comments: Clear to auscultation bilaterally Neurological:     Mental Status: He is alert.  Psychiatric:        Attention and Perception: Attention normal.        Mood and Affect: Mood is depressed. Mood is not anxious. Affect is tearful.        Speech: Speech normal.        Behavior: Behavior is cooperative.        Thought Content: Thought content does not include homicidal or suicidal ideation. Thought content does not include homicidal or suicidal plan.     Comments: Avoids eye contact.  Tearful at times.  Normal appearance/hygiene.  Appropriate speech.      UC Treatments / Results  Labs (all labs ordered are listed, but only abnormal results are displayed) Labs Reviewed - No data to display  EKG   Radiology No results found.  Procedures Procedures (including critical care time)  Medications Ordered  in UC Medications - No data to display  Initial Impression / Assessment and Plan / UC Course  I have reviewed the triage vital signs and the  nursing notes.  Pertinent labs & imaging results that were available during my care of the patient were reviewed by me and considered in my medical decision making (see chart for details).     Patient is well-appearing, afebrile, nontoxic, nontachycardic.  He denies any current suicidal or homicidal ideation and contracts for safety today.  We discussed that unfortunately, we are limited in urgent care what we can initiate in terms of mental health medications.  Will try hydroxyzine to help him with acute anxiety and insomnia.  We discussed that this is sedating and he should not drive or drink alcohol while taking it.  He was given the contact information for behavioral health urgent care with instruction to go there soon as possible.  We discussed that this is open 24/7 and can provide counseling services at the time of intake.  He was also provided additional mental health resources as part of his after visit summary and encouraged to call and schedule an appointment as soon as possible.  He does not currently have a primary care provider so we will try to establish him with 1 via PCP assistance.  If he has any worsening or changing symptoms he is to go to the ER immediately including active suicidal ideation to which she expressed understanding and agreement with plan.  Work excuse note was provided.  Final Clinical Impressions(s) / UC Diagnoses   Final diagnoses:  Acute reaction to situational stress  Insomnia, unspecified type     Discharge Instructions      Please go to the behavioral health urgent care soon as possible.  They are open 24/7.  I have also provided additional behavioral health resources.  Someone should call you to schedule an appointment with primary care.  Start hydroxyzine up to 3 times a day.  This will make you sleepy so do not  drive or drink alcohol while taking it.  If you have any thoughts of wanting to harm yourself or anyone else you need to go to the ER immediately.     ED Prescriptions     Medication Sig Dispense Auth. Provider   hydrOXYzine (ATARAX) 25 MG tablet Take 1 tablet (25 mg total) by mouth every 8 (eight) hours as needed. 21 tablet Jnai Snellgrove, Noberto Retort, PA-C      PDMP not reviewed this encounter.   Jeani Hawking, PA-C 03/27/23 1644

## 2023-03-27 NOTE — ED Triage Notes (Signed)
Pt reports having intermittent insomnia, stating he is experiencing stress and anxiety due to work. 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 Pt is wanting help with mental health.

## 2023-03-27 NOTE — Discharge Instructions (Signed)
Please go to the behavioral health urgent care soon as possible.  They are open 24/7.  I have also provided additional behavioral health resources.  Someone should call you to schedule an appointment with primary care.  Start hydroxyzine up to 3 times a day.  This will make you sleepy so do not drive or drink alcohol while taking it.  If you have any thoughts of wanting to harm yourself or anyone else you need to go to the ER immediately.

## 2023-04-28 ENCOUNTER — Ambulatory Visit (HOSPITAL_COMMUNITY)
Admission: EM | Admit: 2023-04-28 | Discharge: 2023-04-28 | Disposition: A | Discharge status: Home / Self Care | Attending: Behavioral Health | Admitting: Behavioral Health

## 2023-04-28 DIAGNOSIS — G4700 Insomnia, unspecified: Secondary | ICD-10-CM | POA: Diagnosis not present

## 2023-04-28 DIAGNOSIS — F430 Acute stress reaction: Secondary | ICD-10-CM | POA: Diagnosis not present

## 2023-04-28 MED ORDER — TRAZODONE HCL 50 MG PO TABS: 50.0000 mg | ORAL_TABLET | Freq: Every day | ORAL | 0 refills | Status: DC
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
## 2023-04-28 NOTE — ED Notes (Signed)
 Patient discharged home.
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
## 2023-04-28 NOTE — Discharge Instructions (Addendum)
 Discharge recommendations:   Medications: Patient is to take medications as prescribed. The patient or patient's guardian is to contact a medical professional and/or outpatient provider to address any new side effects that develop. The patient or the patient's guardian should update outpatient providers of any new medications and/or medication changes.   Outpatient Follow up: Please review list of outpatient resources for psychiatry and counseling. Please follow up with your primary care provider for all medical related needs.   Therapy: We recommend that patient participate in individual therapy to address mental health concerns.  Safety:   The following safety precautions should be taken:   No sharp objects. This includes scissors, razors, scrapers, and putty knives.   Chemicals should be removed and locked up.   Medications should be removed and locked up.   Weapons should be removed and locked up. This includes firearms, knives and instruments that can be used to cause injury.   The patient should abstain from use of illicit substances/drugs and abuse of any medications.  If symptoms worsen or do not continue to improve or if the patient becomes actively suicidal or homicidal then it is recommended that the patient return to the closest hospital emergency department, the [facility], or call 911 for further evaluation and treatment. National Suicide Prevention Lifeline 1-800-SUICIDE or (573)415-4145.  About 988 988 offers 24/7 access to trained crisis counselors who can help people experiencing mental health-related distress. People can call or text 988 or chat 988lifeline.org for themselves or if they are worried about a loved one who may need crisis support.   Please contact one of the following facilities to start medication management and therapy services:   [facility] [address]  [place], [place]  [address] [phone]   [facility]  [address] [place], [place] [address] [phone]  [facility] [place]  [address]ella Raring [place], [place] [address] [phone]  [facility]  [address]  [place], [place] [address] [phone]  [facility]  [address] [place], [place] [address] [phone]  [facility]  [address] Renee Rival  [place], [place] [address] [phone]  [facility] Address: [address] [place], [place], [place] [address] Phone: [phone] Services: Specialize in outpatient and substance abuse therapy for couples, family and children  [facility] Address: [address], [place], [place] [address] Phone: [phone]  [facility] Behavioral Medicine - [facility] Address: [address], [place], [place] [address] Phone: [phone]

## 2023-05-16 ENCOUNTER — Encounter: Payer: Self-pay | Admitting: Emergency Medicine

## 2023-05-16 ENCOUNTER — Ambulatory Visit
Admission: EM | Admit: 2023-05-16 | Discharge: 2023-05-16 | Disposition: A | Discharge status: Home / Self Care | Attending: Nurse Practitioner | Admitting: Nurse Practitioner

## 2023-05-16 DIAGNOSIS — R197 Diarrhea, unspecified: Secondary | ICD-10-CM

## 2023-05-16 DIAGNOSIS — T50905A Adverse effect of unspecified drugs, medicaments and biological substances, initial encounter: Secondary | ICD-10-CM | POA: Diagnosis not present

## 2023-05-16 DIAGNOSIS — R112 Nausea with vomiting, unspecified: Secondary | ICD-10-CM | POA: Diagnosis not present

## 2023-05-16 DIAGNOSIS — Z8659 Personal history of other mental and behavioral disorders: Secondary | ICD-10-CM

## 2023-05-16 HISTORY — DX: Depression, unspecified: F32.A

## 2023-05-16 MED ORDER — ONDANSETRON 4 MG PO TBDP: 4.0000 mg | ORAL_TABLET | Freq: Three times a day (TID) | ORAL | 0 refills | Status: DC | PRN

## 2023-05-16 NOTE — ED Provider Notes (Signed)
 [facility]    CSN: [id number] Arrival date & time: 05/16/23  0801      History   Chief Complaint No chief complaint on file.   HPI Stephen Cooper is a 38 y.o. male.   The history is provided by the patient.   Patient presents for complaints of nausea, vomiting, and diarrhea that started over the past 2 days.  Patient states he recently started trazodone over the past week.  Patient states that symptoms are more persistent in the morning.  He states that he is experiencing 2-3 episodes of diarrhea during the day.  Patient denies fever, chills, chest pain, abdominal pain, bloody stools, gas, bloating, constipation, or urinary symptoms.  Patient states that he is currently taking trazodone 50 mg.  States that he does not want to stop the medication as he feels it has improved his sleep and mood.  Patient with underlying history of depression.  Patient states that he did take the medication with food the night prior, but did not notice any improvement of his symptoms.  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 Authorizing Provider  albuterol (VENTOLIN HFA) 108 (90 Base) MCG/ACT inhaler Inhale 2 puffs into the lungs every 4 (four) hours as needed for wheezing or shortness of breath. 04/01/21   Merrilee Jansky, MD  traZODone (DESYREL) 50 MG tablet Take 1 tablet (50 mg total) by mouth at bedtime. 04/28/23   Layla Barter, NP    Family History History reviewed. No pertinent family history.  Social History Social History   Tobacco Use   Smoking status: Former    Smokeless tobacco: Current    Types: Snuff   Tobacco comments:    pt smokes Hookah once a week  Vaping Use   Vaping status: Never Used  Substance Use Topics   Alcohol use: Not Currently    Comment: occasional liquor   Drug use: No     Allergies   Patient has no known allergies.   Review of Systems Review of Systems Per HPI  Physical Exam Triage Vital Signs ED Triage Vitals  Encounter Vitals Group     BP 05/16/23 0820 129/84     Systolic BP Percentile --      Diastolic BP Percentile --      Pulse Rate 05/16/23 0820 76     Resp 05/16/23 0820 18     Temp 05/16/23 0820 98.3 F (36.8 C)     Temp Source 05/16/23 0820 Oral     SpO2 05/16/23 0820 96 %     Weight --      Height --      Head Circumference --      Peak Flow --      Pain Score 05/16/23 0821 0     Pain Loc --      Pain Education --      Exclude from Growth Chart --    No data found.  Updated Vital Signs  BP 129/84 (BP Location: Right Arm)   Pulse 76   Temp 98.3 F (36.8 C) (Oral)   Resp 18   SpO2 96%   Visual Acuity Right Eye Distance:   Left Eye Distance:   Bilateral Distance:    Right Eye Near:   Left Eye Near:    Bilateral Near:     Physical Exam Vitals and nursing note reviewed.  Constitutional:      General: He is not in acute distress.    Appearance: Normal appearance.  HENT:     Head: Normocephalic.  Eyes:     Extraocular Movements: Extraocular movements intact.     Conjunctiva/sclera: Conjunctivae normal.     Pupils: Pupils are equal, round, and reactive to light.  Cardiovascular:     Rate and Rhythm: Normal rate and regular rhythm.     Pulses: Normal pulses.     Heart sounds: Normal heart sounds.  Pulmonary:     Effort: Pulmonary effort is normal. No respiratory distress.     Breath sounds: Normal breath sounds. No stridor. No wheezing, rhonchi or rales.  Abdominal:     General: Bowel sounds are normal. There is no distension.     Palpations: Abdomen is soft.      Tenderness: There is no abdominal tenderness. There is no guarding or rebound.  Musculoskeletal:     Cervical back: Normal range of motion.  Skin:    General: Skin is warm and dry.  Neurological:     General: No focal deficit present.     Mental Status: He is alert and oriented to person, place, and time.  Psychiatric:        Mood and Affect: Mood normal.        Behavior: Behavior normal.      UC Treatments / Results  Labs (all labs ordered are listed, but only abnormal results are displayed) Labs Reviewed - No data to display  EKG   Radiology No results found.  Procedures Procedures (including critical care time)  Medications Ordered in UC Medications - No data to display  Initial Impression / Assessment and Plan / UC Course  I have reviewed the triage vital signs and the nursing notes.  Pertinent labs & imaging results that were available during my care of the patient were reviewed by me and considered in my medical decision making (see chart for details).  The patient is well-appearing, he is in no acute distress, vital signs are stable.  Patient recently started on trazodone 50 mg.  Suspect patient may be experiencing medication side effects from the medication.  Discussion with patient that psychotropic medications can cause these symptoms when they are initiated.  Advised patient to reach out to his provider to determine if titrating the dose down until he can tolerate the medication would be appropriate.  In the interim, Zofran 4 mg ODT was prescribed for nausea and vomiting.  For diarrhea, patient was given information regarding foods to help relieve those symptoms.  Supportive care recommendations were provided and discussed with the patient to include fluids, rest, and over-the-counter analgesics as needed.  Patient was in agreement with this plan of care and verbalized understanding.  All questions were answered.  Patient stable for discharge.  Work note was  provided.  Final Clinical Impressions(s) / UC Diagnoses   Final diagnoses:  None   Discharge Instructions   None    ED Prescriptions   None    PDMP not reviewed this encounter.   Leath-Warren,  Sadie Haber, NP 05/16/23 [phone]

## 2023-05-16 NOTE — ED Triage Notes (Signed)
 Nausea x 2 days.  Worse in morning time.  Has been having diarrhea.

## 2023-05-16 NOTE — Discharge Instructions (Signed)
 Take medication as prescribed. As discussed, please reach out to the provider who prescribed the medication, trazodone.  Discussed with provider if it is safe to titrate the medication or decrease the dose until you are able to tolerate the medication.  Safely increase the dose as recommended by that provider. Make sure you are taking the medication with food on your stomach. Recommend a brat diet until nausea and vomiting improved.  This includes bananas, rice, applesauce, and toast. Also recommend increasing the fiber in your diet to help decrease your diarrhea symptoms.  I have provided information for you to refer to. If symptoms fail to improve, please reach out to your provider to discuss safe medication alternatives. Follow-up as needed.

## 2023-06-22 ENCOUNTER — Telehealth: Payer: Self-pay

## 2023-06-22 NOTE — Telephone Encounter (Signed)
 This patient has called asking if there is any way he can be seen this week. He is in need of restarting his depression medication. He currently sees a Warden/ranger for mental health counseling and is on Trazodone . He is going to email his last visit to us . Is there anyway he can come in tomorrow morning at 8? He is having side effects from being off of the depression medication.

## 2023-06-23 ENCOUNTER — Encounter: Payer: Self-pay | Admitting: Family Medicine

## 2023-06-23 ENCOUNTER — Ambulatory Visit: Admitting: Family Medicine

## 2023-06-23 VITALS — BP 124/78 | HR 75 | Temp 98.5°F | Ht 73.0 in | Wt 330.2 lb

## 2023-06-23 DIAGNOSIS — F322 Major depressive disorder, single episode, severe without psychotic features: Secondary | ICD-10-CM | POA: Insufficient documentation

## 2023-06-23 DIAGNOSIS — G4709 Other insomnia: Secondary | ICD-10-CM | POA: Diagnosis not present

## 2023-06-23 DIAGNOSIS — Z0000 Encounter for general adult medical examination without abnormal findings: Secondary | ICD-10-CM | POA: Insufficient documentation

## 2023-06-23 DIAGNOSIS — F411 Generalized anxiety disorder: Secondary | ICD-10-CM | POA: Diagnosis not present

## 2023-06-23 DIAGNOSIS — Z23 Encounter for immunization: Secondary | ICD-10-CM | POA: Diagnosis not present

## 2023-06-23 DIAGNOSIS — G4700 Insomnia, unspecified: Secondary | ICD-10-CM | POA: Insufficient documentation

## 2023-06-23 DIAGNOSIS — G4733 Obstructive sleep apnea (adult) (pediatric): Secondary | ICD-10-CM | POA: Diagnosis not present

## 2023-06-23 MED ORDER — ESCITALOPRAM OXALATE 10 MG PO TABS: 10.0000 mg | ORAL_TABLET | Freq: Every day | ORAL | 1 refills | Status: DC

## 2023-06-23 MED ORDER — TRAZODONE HCL 50 MG PO TABS: 50.0000 mg | ORAL_TABLET | Freq: Every day | ORAL | 1 refills | Status: DC

## 2023-06-23 MED ORDER — BUSPIRONE HCL 5 MG PO TABS: 5.0000 mg | ORAL_TABLET | Freq: Two times a day (BID) | ORAL | 1 refills | Status: DC

## 2023-06-23 NOTE — Progress Notes (Unsigned)
 Patient Office Visit  Assessment & Plan:  Depression, major, single episode, severe (HCC) -     Escitalopram Oxalate; Take 1 tablet (10 mg total) by mouth daily.  Dispense: 90 tablet; Refill: 1 -     CBC with Differential/Platelet -     Comprehensive metabolic panel with GFR  Generalized anxiety disorder -     busPIRone HCl; Take 1 tablet (5 mg total) by mouth 2 (two) times daily.  Dispense: 60 tablet; Refill: 1 -     TSH  Other insomnia -     traZODone  HCl; Take 1 tablet (50 mg total) by mouth at bedtime.  Dispense: 90 tablet; Refill: 1  Encounter for medical examination to establish care -     Lipid panel  OSA (obstructive sleep apnea)  Need for Tdap vaccination -     Tdap vaccine greater than or equal to 7yo IM   Test results were reviewed and analyzed as part of the medical decision making of this visit. Reviewed extensive psychological testing done on April 15th during office visit. OOW note given for today.  Recommend healthy diet i.e mediterranean/DASH diet, consistent exercise - 30 minutes 5 day per week, and gradual weight loss. Start Lexapro 10mg  once per day. Buspar 5mg  twice per day as needed for anxiety. Continue Trazodone  25mg  at night time take one hour prior to bedtime. Pt will start psychotherapy in the near future and encouraged her to do so. Tdap updated today.  No follow-ups on file.   Subjective:    Patient ID: Stephen Cooper, male    DOB: [birth year]  Age: 38 y.o. MRN: [id number]  No chief complaint on file.   HPI Establish primary care and discuss depression/anxiety and insomnia.  Depression/Anxiety- pt having depression and GAD since childhood. Patient has had childhood trauma which he never dealt with until now. Pt wants to feel better and is seeking help. Pt has been feeling depressed with suicidal ideation for the past few weeks and went to Urgent Care who Rx Trazodone  for one mos only. Pt was also sent to extensive psychological testing which  took about 4 hours (reviewed today).  Pt has no plan to harm himself but still feeling very depressed and anxious. FH positive for undiagnosed depression/anxiety in multiple family members. Pt is open to take medication and this is why he is here today. Pt is engaged right now and has good support system from fiancee and work buddies. Patient working full time and is able to function at work for the most part.  Pt also having panic attacks on and off. Pt having panic attack with nausea during the office today due to room having poor circulation. Patient is aware of this and is able to calm himself down. Pt has never taken anything for panic before. No alcohol usage  Due to ongoing stressors/anxiety/panic patient does not think he can go to work today. Is requesting a work note.  Insomnia- pt taking Trazodone  25mg  - getting 6 hours at night time Pt would like to exercise. Pt having aversion to food recently. Patient has lost weight the past few years but has been intentional .  OSA- pt does uses CPAP which does help him. Pt thinks the combo of Trazodone  and CPAP are helping him get better quality sleep.  Health Maintenance- pt needs Tdap today.   The ASCVD Risk score (Arnett DK, et al., 2019) failed to calculate for the following reasons:   The 2019 ASCVD risk score is  only valid for ages 55 to 76  Past Medical History:  Diagnosis Date  . 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 Depression   . Hypertension   . Sleep apnea    Past Surgical History:  Procedure Laterality Date  . FINGER SURGERY     2007/2008?   Social History   Tobacco Use  . Smoking status: Former  . Smokeless tobacco: Current    Types: Snuff  . Tobacco comments:    pt smokes Hookah once a week  Vaping Use  . Vaping status: Never Used  Substance Use Topics  . Alcohol use: Not Currently    Comment: occasional liquor  . Drug use: No   Family History  Problem Relation Age of Onset  . Depression Mother    . Dementia Maternal Grandfather   . Anuerysm Paternal Aunt   . Diabetes Mellitus II Maternal Aunt   . Coronary artery disease Neg Hx   . Premature CHD Neg Hx    No Known Allergies  ROS    Objective:    Ht 6\' 1"  (1.854 m)   Wt (!) 330 lb 4 oz (149.8 kg)   BMI 43.57 kg/m  BP Readings from Last 3 Encounters:  05/16/23 129/84  03/27/23 (!) 145/101  03/30/21 (!) 135/91   Wt Readings from Last 3 Encounters:  06/23/23 (!) 330 lb 4 oz (149.8 kg)  10/01/19 (!) 388 lb 0.2 oz (176 kg)  09/20/16 (!) 382 lb 9.6 oz (173.5 kg)    Physical Exam Vitals and nursing note reviewed.  Constitutional:      Appearance: Normal appearance.  HENT:     Head: Normocephalic.     Right Ear: Tympanic membrane, ear canal and external ear normal.     Left Ear: Tympanic membrane, ear canal and external ear normal.  Eyes:     Extraocular Movements: Extraocular movements intact.     Conjunctiva/sclera: Conjunctivae normal.     Pupils: Pupils are equal, round, and reactive to light.  Cardiovascular:     Rate and Rhythm: Normal rate and regular rhythm.     Heart sounds: Normal heart sounds.  Pulmonary:     Effort: Pulmonary effort is normal.     Breath sounds: Normal breath sounds.  Musculoskeletal:     Right lower leg: No edema.     Left lower leg: No edema.  Neurological:     General: No focal deficit present.     Mental Status: He is alert and oriented to person, place, and time.  Psychiatric:        Attention and Perception: Attention and perception normal.        Mood and Affect: Affect normal. Mood is anxious.        Speech: Speech normal.        Behavior: Behavior normal. Behavior is cooperative.        Thought Content: Thought content normal.        Cognition and Memory: Cognition and memory normal.        Judgment: Judgment normal.     Results for orders placed or performed in visit on 06/23/23  CBC with Differential/Platelet  Result Value Ref Range   WBC 7.0 3.8 - 10.8  Thousand/uL   RBC 5.31 4.20 - 5.80 Million/uL   Hemoglobin 15.6 13.2 - 17.1 g/dL   HCT 40.9 81.1 - 91.4 %   MCV 85.5 80.0 - 100.0 fL   MCH 29.4 27.0 -  33.0 pg   MCHC 34.4 32.0 - 36.0 g/dL   RDW 16.1 09.6 - 04.5 %   Platelets 278 140 - 400 Thousand/uL   MPV 10.8 7.5 - 12.5 fL   Neutro Abs 4,704 1,500 - 7,800 cells/uL   Absolute Lymphocytes 1,764 850 - 3,900 cells/uL   Absolute Monocytes 455 200 - 950 cells/uL   Eosinophils Absolute 49 15 - 500 cells/uL   Basophils Absolute 28 0 - 200 cells/uL   Neutrophils Relative % 67.2 %   Total Lymphocyte 25.2 %   Monocytes Relative 6.5 %   Eosinophils Relative 0.7 %   Basophils Relative 0.4 %  Comprehensive metabolic panel with GFR  Result Value Ref Range   Glucose, Bld 90 65 - 99 mg/dL   BUN 12 7 - 25 mg/dL   Creat 4.09 (H) 8.11 - 1.26 mg/dL   eGFR 70 > OR = 60 BJ/YNW/2.95A2   BUN/Creatinine Ratio 9 6 - 22 (calc)   Sodium 138 135 - 146 mmol/L   Potassium 4.3 3.5 - 5.3 mmol/L   Chloride 102 98 - 110 mmol/L   CO2 29 20 - 32 mmol/L   Calcium 9.4 8.6 - 10.3 mg/dL   Total Protein 7.3 6.1 - 8.1 g/dL   Albumin 4.6 3.6 - 5.1 g/dL   Globulin 2.7 1.9 - 3.7 g/dL (calc)   AG Ratio 1.7 1.0 - 2.5 (calc)   Total Bilirubin 1.0 0.2 - 1.2 mg/dL   Alkaline phosphatase (APISO) 66 36 - 130 U/L   AST 21 10 - 40 U/L   ALT 26 9 - 46 U/L  Lipid panel  Result Value Ref Range   Cholesterol 122 <200 mg/dL   HDL 33 (L) > OR = 40 mg/dL   Triglycerides 65 <130 mg/dL   LDL Cholesterol (Calc) 75 mg/dL (calc)   Total CHOL/HDL Ratio 3.7 <5.0 (calc)   Non-HDL Cholesterol (Calc) 89 <865 mg/dL (calc)  TSH  Result Value Ref Range   TSH 0.59 0.40 - 4.50 mIU/L

## 2023-06-24 LAB — LIPID PANEL
Cholesterol: 122 mg/dL (ref <200)
HDL: 33 mg/dL — ABNORMAL LOW (ref >=40)
LDL Cholesterol (Calc): 75 mg/dL
Non-HDL Cholesterol (Calc): 89 mg/dL (ref <130)
Total CHOL/HDL Ratio: 3.7 (calc) (ref <5.0)
Triglycerides: 65 mg/dL (ref <150)

## 2023-06-24 LAB — COMPREHENSIVE METABOLIC PANEL WITH GFR
AG Ratio: 1.7 (calc) (ref 1.0–2.5)
ALT: 26 U/L (ref 9–46)
AST: 21 U/L (ref 10–40)
Albumin: 4.6 g/dL (ref 3.6–5.1)
Alkaline phosphatase (APISO): 66 U/L (ref 36–130)
BUN/Creatinine Ratio: 9 (calc) (ref 6–22)
BUN: 12 mg/dL (ref 7–25)
CO2: 29 mmol/L (ref 20–32)
Calcium: 9.4 mg/dL (ref 8.6–10.3)
Chloride: 102 mmol/L (ref 98–110)
Creat: 1.35 mg/dL — ABNORMAL HIGH (ref 0.60–1.26)
Globulin: 2.7 g/dL (ref 1.9–3.7)
Glucose, Bld: 90 mg/dL (ref 65–99)
Potassium: 4.3 mmol/L (ref 3.5–5.3)
Sodium: 138 mmol/L (ref 135–146)
Total Bilirubin: 1 mg/dL (ref 0.2–1.2)
Total Protein: 7.3 g/dL (ref 6.1–8.1)
eGFR: 70 mL/min/1.73m2 (ref >=60)

## 2023-06-24 LAB — CBC WITH DIFFERENTIAL/PLATELET
Absolute Lymphocytes: 1764 {cells}/uL (ref 850–3900)
Absolute Monocytes: 455 {cells}/uL (ref 200–950)
Basophils Absolute: 28 {cells}/uL (ref 0–200)
Basophils Relative: 0.4 %
Eosinophils Absolute: 49 {cells}/uL (ref 15–500)
Eosinophils Relative: 0.7 %
HCT: 45.4 % (ref 38.5–50.0)
Hemoglobin: 15.6 g/dL (ref 13.2–17.1)
MCH: 29.4 pg (ref 27.0–33.0)
MCHC: 34.4 g/dL (ref 32.0–36.0)
MCV: 85.5 fL (ref 80.0–100.0)
MPV: 10.8 fL (ref 7.5–12.5)
Monocytes Relative: 6.5 %
Neutro Abs: 4704 {cells}/uL (ref 1500–7800)
Neutrophils Relative %: 67.2 %
Platelets: 278 10*3/uL (ref 140–400)
RBC: 5.31 10*6/uL (ref 4.20–5.80)
RDW: 12.9 % (ref 11.0–15.0)
Total Lymphocyte: 25.2 %
WBC: 7 10*3/uL (ref 3.8–10.8)

## 2023-06-24 LAB — TSH: TSH: 0.59 m[IU]/L (ref 0.40–4.50)

## 2023-06-26 ENCOUNTER — Encounter: Payer: Self-pay | Admitting: Family Medicine

## 2023-06-27 ENCOUNTER — Encounter: Payer: Self-pay | Admitting: Family Medicine

## 2023-07-11 ENCOUNTER — Ambulatory Visit: Payer: Self-pay | Admitting: Family Medicine

## 2023-07-31 ENCOUNTER — Encounter: Payer: Self-pay | Admitting: Family Medicine

## 2023-07-31 ENCOUNTER — Ambulatory Visit (INDEPENDENT_AMBULATORY_CARE_PROVIDER_SITE_OTHER): Admitting: Family Medicine

## 2023-07-31 VITALS — BP 124/82 | HR 60 | Temp 98.2°F | Ht 73.0 in | Wt 322.0 lb

## 2023-07-31 DIAGNOSIS — G4709 Other insomnia: Secondary | ICD-10-CM | POA: Diagnosis not present

## 2023-07-31 DIAGNOSIS — F322 Major depressive disorder, single episode, severe without psychotic features: Secondary | ICD-10-CM

## 2023-07-31 DIAGNOSIS — F411 Generalized anxiety disorder: Secondary | ICD-10-CM

## 2023-07-31 MED ORDER — ESCITALOPRAM OXALATE 10 MG PO TABS: 10.0000 mg | ORAL_TABLET | Freq: Every day | ORAL | 1 refills | Status: DC

## 2023-07-31 MED ORDER — TRAZODONE HCL 50 MG PO TABS: 50.0000 mg | ORAL_TABLET | Freq: Every day | ORAL | 1 refills | Status: DC

## 2023-07-31 NOTE — Progress Notes (Signed)
 Patient Office Visit  Assessment & Plan:  Generalized anxiety disorder -     Ambulatory referral to Psychology -     Ambulatory referral to Psychiatry  Depression, major, single episode, severe (HCC) -     Ambulatory referral to Psychology -     Ambulatory referral to Psychiatry -     Escitalopram  Oxalate; Take 1 tablet (10 mg total) by mouth daily.  Dispense: 90 tablet; Refill: 1  Other insomnia -     traZODone  HCl; Take 1 tablet (50 mg total) by mouth at bedtime.  Dispense: 90 tablet; Refill: 1   Assessment and Plan    Major Depressive Disorder Persistent depressive symptoms despite Lexapro  and Buspar . Therapy and psychiatric evaluation deemed necessary. - Refer to psychotherapy in [place]. - Continue Lexapro  10 mg daily. - Continue Buspar  as prescribed. - Emphasized consistent therapy sessions.  Generalized Anxiety Disorder Buspar  prescribed, effectiveness unclear. Therapy emphasized for emotional management. - Continue Buspar  as prescribed. - Refer to psychotherapy for anxiety management.  Sleep Disturbance Difficulty with sleep, morning grogginess with trazodone . CPAP effective for sleep apnea. - Continue trazodone  50 mg, discuss timing to optimize sleep. - Continue CPAP for sleep apnea.  Psychiatric Evaluation Regular psychiatric evaluation needed for medication and psychiatric management. - Refer to psychiatry for medication management and evaluation.  General Health Maintenance 8-pound weight loss, increased physical activity, plans to improve diet. - Encourage continued physical activity and dietary improvements.  Follow-up Medication refills needed, therapy and psychiatric evaluation emphasized. - Refill Lexapro  and trazodone  at [facility]. - Ensure sufficient Buspar  refills. - Follow up on therapy and psychiatry referrals.          No follow-ups on file.   Subjective:     Patient ID: Stephen Cooper, male    DOB: December 11, 1985  Age: 38 y.o.  MRN: [id number]  No chief complaint on file.   HPI Discussed the use of AI scribe software for clinical note transcription with the patient, who gave verbal consent to proceed.  History of Present Illness        History of Present Illness Stephen Cooper is a 38 year old male who presents with ongoing depression and anxiety management.  He has been experiencing persistent depression and anxiety. He is currently taking Lexapro , initially started at 10 mg, which has provided some clarity and reduced the 'gray' feeling of depression. However, he is experiencing strong emotions that he finds difficult to manage and expresses a need for therapy to help with these emotions. He denies any suicidal thoughts but reports low energy and ongoing feelings of depression.  He has not yet started psychotherapy, although it was discussed previously. An extensive evaluation was conducted prior to this visit for diagnostic purposes, but it did not result in a therapy referral. He is seeking to start psychotherapy and wishes to see a psychiatrist regularly.  He takes Buspar  twice a day for anxiety but is unsure of its effectiveness. For sleep, he takes trazodone , which causes morning sickness and does not consistently help him fall asleep. He takes it between 9 PM and 11:30 PM, depending on his bedtime, and notes that it must be taken with food to avoid morning nausea and vomiting.  He uses a CPAP machine, which helps him feel like he is actually sleeping, although he does not know the exact number of hours he sleeps. He engages in [place] and has been more active with his fiance, which he notes as a positive change. He  has lost eight pounds, although he describes his eating habits as unhealthy. Patient does not want to change medications at this time but would like to see psychiatry in the future to manage medications.  Assessment and Plan Major Depressive Disorder Persistent depressive symptoms despite  Lexapro  and Buspar . Therapy and psychiatric evaluation deemed necessary. - Refer to psychotherapy in [place]. - Continue Lexapro  10 mg daily. - Continue Buspar  as prescribed. - Emphasized consistent therapy sessions.  Generalized Anxiety Disorder Buspar  prescribed, effectiveness unclear. Therapy emphasized for emotional management. - Continue Buspar  as prescribed. - Refer to psychotherapy for anxiety management.  Sleep Disturbance Difficulty with sleep, morning grogginess with trazodone . CPAP effective for sleep apnea. - Continue trazodone  50 mg, discuss timing to optimize sleep. - Continue CPAP for sleep apnea.  Psychiatric Evaluation Regular psychiatric evaluation needed for medication and psychiatric management. - Refer to psychiatry for medication management and evaluation.  General Health Maintenance 8-pound weight loss, increased physical activity, plans to improve diet. - Encourage continued physical activity and dietary improvements.  Follow-up Medication refills needed, therapy and psychiatric evaluation emphasized. - Refill Lexapro  and trazodone  at [facility]. - Ensure sufficient Buspar  refills. - Follow up on therapy and psychiatry referrals.    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   Social History   Tobacco Use   Smoking status: Former   Smokeless tobacco: Current    Types: Snuff   Tobacco comments:    pt smokes Hookah once a week  Vaping Use   Vaping status: Never Used  Substance Use Topics   Alcohol use: Not Currently    Comment: occasional liquor   Drug use: No   Family History  Problem Relation Age of  Onset   Depression Mother    Dementia Maternal Grandfather    Anuerysm Paternal Aunt    Diabetes Mellitus II Maternal Aunt    Coronary artery disease Neg Hx    Premature CHD Neg Hx    No Known Allergies  ROS    Objective:    BP 124/82   Pulse 60   Temp 98.2 F (36.8 C)   Ht 6\' 1"  (1.854 m)   Wt (!) 322 lb (146.1 kg)   SpO2 99%   BMI 42.48 kg/m  BP Readings from Last 3 Encounters:  07/31/23 124/82  06/23/23 124/78  05/16/23 129/84   Wt Readings from Last 3 Encounters:  07/31/23 (!) 322 lb (146.1 kg)  06/23/23 (!) 330 lb 4 oz (149.8 kg)  10/01/19 (!) 388 lb 0.2 oz (176 kg)    Physical Exam Vitals and nursing note reviewed.  Constitutional:      General: He is not in acute distress.    Appearance: Normal appearance.  HENT:     Head: Normocephalic.     Right Ear: Tympanic membrane and ear canal normal.     Left Ear: Tympanic membrane and ear canal normal.  Eyes:     Extraocular Movements: Extraocular movements intact.     Pupils: Pupils are equal, round, and reactive to light.  Cardiovascular:  Rate and Rhythm: Normal rate and regular rhythm.     Heart sounds: Normal heart sounds.  Pulmonary:     Effort: Pulmonary effort is normal.     Breath sounds: Normal breath sounds.  Neurological:     General: No focal deficit present.     Mental Status: He is alert and oriented to person, place, and time.  Psychiatric:        Attention and Perception: Attention and perception normal.        Mood and Affect: Mood normal.        Behavior: Behavior normal. Behavior is cooperative.        Cognition and Memory: Cognition normal.     Comments: Does not always make eye contact during office visit.       No results found for any visits on 07/31/23.

## 2023-08-28 ENCOUNTER — Ambulatory Visit (INDEPENDENT_AMBULATORY_CARE_PROVIDER_SITE_OTHER): Admitting: Licensed Clinical Social Worker

## 2023-08-28 DIAGNOSIS — Z8659 Personal history of other mental and behavioral disorders: Secondary | ICD-10-CM

## 2023-08-28 DIAGNOSIS — F331 Major depressive disorder, recurrent, moderate: Secondary | ICD-10-CM

## 2023-08-28 DIAGNOSIS — F4310 Post-traumatic stress disorder, unspecified: Secondary | ICD-10-CM

## 2023-08-28 DIAGNOSIS — F411 Generalized anxiety disorder: Secondary | ICD-10-CM
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
## 2023-09-01 ENCOUNTER — Other Ambulatory Visit: Payer: Self-pay | Admitting: Family Medicine

## 2023-09-01 DIAGNOSIS — F411 Generalized anxiety disorder: Secondary | ICD-10-CM

## 2023-09-13 ENCOUNTER — Ambulatory Visit (INDEPENDENT_AMBULATORY_CARE_PROVIDER_SITE_OTHER): Payer: Self-pay | Admitting: Licensed Clinical Social Worker

## 2023-09-13 DIAGNOSIS — F411 Generalized anxiety disorder: Secondary | ICD-10-CM | POA: Diagnosis not present

## 2023-09-13 DIAGNOSIS — F4310 Post-traumatic stress disorder, unspecified: Secondary | ICD-10-CM | POA: Diagnosis not present

## 2023-09-13 DIAGNOSIS — F331 Major depressive disorder, recurrent, moderate: Secondary | ICD-10-CM

## 2023-09-14 NOTE — Progress Notes (Signed)
 [facility] Counselor/Therapist Progress Note  Patient ID: DAISON BRAXTON, MRN: [id number]    Date: 09/13/2023  Time Spent: 0300  pm - 0358 pm : 58 Minutes  Treatment Type: Individual Therapy.  Reported Symptoms: Patient reports that he had an event where he could not sleep for over 37 hours. He found that he decided to work on himself. He is now on medication, depression, anxiety, PTSD and ADHD are a few of the symptoms.   Mental Status Exam: Appearance:  Neat     Behavior: Appropriate  Motor: Normal  Speech/Language:  Clear and Coherent  Affect: Flat  Mood: normal  Thought process: normal  Thought content:   WNL  Sensory/Perceptual disturbances:   WNL  Orientation: oriented to person, place, time/date, situation, day of week, month of year, and year  Attention: Good  Concentration: Good  Memory: WNL  Fund of knowledge:  Good  Insight:   Good  Judgment:  Good  Impulse Control: Good   Risk Assessment: Danger to Self:  No Self-injurious Behavior: No Danger to Others: No Duty to Warn:no Physical Aggression / Violence:No  Access to Firearms a concern: No  Gang Involvement:No   Subjective:   [name] participated in person from office, located at [employer]. Patient  consented to treatment. Therapist participated from office located at [facility].   Patient presented for his session stating that he has has a lot going on. He reported having an issue at work and he feels that he is going to lose his job. He states that it's concerning as he won't have insurance and will have to try to find another job. He states that he has trouble keeping jobs for an extended period of time. He reports that he sent an email and people got angry. He reports he was ask to tell fellow employees what to do and he felt it wasn't appropriate.   Clinician actively listened and provided feedback and support via verbal feedback. Clinician processed with patient  his perspective and how he felt about the situation.Clinician processed with patient his concerns over losing his job and insurance. Clinician discussed possible resources to assist patient during this time. Clinician processed coping skills and self- calming skills to assist in remaining calm.  Arjun was fully engaged in discussion and was open in his interaction with Clinician. Patient is motivated for treatment. Corky will utilize coping skills to assist with his symptoms of anxiety and depression related to his PTSD diagnosis. Vern will continue to engage in bi weekly therapy sessions. Treatment planning to be reviewed by 08/27/2024.  Interventions: Cognitive Behavioral Therapy, Assertiveness/Communication, Motivational Interviewing, and Solution-Oriented/Positive Psychology  Diagnosis: PTSD, Generalized Anxiety Disorder, Major Depressive Disorder, recurrent, moderate   Damien Junk MSW, LCSW/DATE 09/13/2023

## 2023-09-20 NOTE — Addendum Note (Signed)
 Addended by: SCHUYLER DAMIEN HERO on: 09/20/2023 03:59 PM   Modules accepted: Level of Service

## 2023-09-28 ENCOUNTER — Ambulatory Visit (INDEPENDENT_AMBULATORY_CARE_PROVIDER_SITE_OTHER): Payer: Self-pay | Admitting: Licensed Clinical Social Worker

## 2023-09-28 DIAGNOSIS — F331 Major depressive disorder, recurrent, moderate: Secondary | ICD-10-CM

## 2023-09-28 DIAGNOSIS — F411 Generalized anxiety disorder: Secondary | ICD-10-CM

## 2023-09-28 DIAGNOSIS — F4310 Post-traumatic stress disorder, unspecified: Secondary | ICD-10-CM

## 2023-09-28 DIAGNOSIS — Z8659 Personal history of other mental and behavioral disorders: Secondary | ICD-10-CM

## 2023-09-29 NOTE — Progress Notes (Signed)
 [facility] Counselor/Therapist Progress Note  Patient ID: Stephen Cooper, MRN: [id number]    Date: 09/29/23  Time Spent: 308  pm - 0403 pm : 55 Minutes  Treatment Type: Individual Therapy.  Reported Symptoms: Patient reports that he had an event where he could not sleep for over 37 hours. He found that he decided to work on himself. He is now on medication, depression, anxiety, PTSD and ADHD are a few of the symptoms.    Mental Status Exam: Appearance:  Neat     Behavior: Appropriate  Motor: Normal  Speech/Language:  Clear and Coherent  Affect: Flat  Mood: Depressed  Thought process: normal  Thought content:   WNL  Sensory/Perceptual disturbances:   WNL  Orientation: oriented to person, place, time/date, situation, day of week, month of year, and year  Attention: Good  Concentration: Good  Memory: WNL  Fund of knowledge:  Good  Insight:   Good  Judgment:  Good  Impulse Control: Good    Risk Assessment: Danger to Self:  No Self-injurious Behavior: No Danger to Others: No Duty to Warn:no Physical Aggression / Violence:No  Access to Firearms a concern: No  Gang Involvement:No    Subjective:    Stephen Cooper participated in person from office, located at [employer]. Patient  consented to treatment. Therapist participated from office located at [facility].   Shelly presented for his session reporting that he did lose his job the week he came for therapy last. He reports that he has not had motivation to even look for another job and is feeling down on himself at the moment. He reports that his Dad keeps asking how the job search is going and his mother avoids the conversation. He reports that he feels overwhelmed and uncertain how to move forward and pick himself up again. He states that he even feels that how he feels rolls over to his relationship with Elle his girlfriend. He states that as of now he feels very alone and when he tells  her his feelings she will mimic the same and then it's both of them feeling a certain ways and he doesn't get understanding.  Clinician actively listened and provided feedback via verbal interaction. Clinician processed with patient his concerns and his lack of motivation. Clinician challenged patient to apply for one job daily over the next week. Clinician encouraged patient to update his resume and to use that time daily to focus on completing the applications. After that he is to attempt to not discuss or dwell on the job search. He was challenged to organize his space and focus on getting what he is in control of in order. Patient had reported that his home was very disorganized due to his mother being a Chartered loss adjuster. Patient agreed that this could help improve his mood and clear his mind to get his space organized.   Stephen Cooper was fully engaged in discussion. He was emotional at times and reported that he is overwhelmed and concerned about his inability to keep a job. Stephen Cooper will utilize coping skills to decrease symptoms and will continue to seek employment. He will go to [facility] to see if he qualifies for medicaid so that he can stay on his medications and continue with medical treatment. Patient was scheduled another appointment for two weeks and will continue with CBT therapy.Treatment plan to be reviewed by 08/27/2024.  Interventions: Cognitive Behavioral Therapy, Assertiveness/Communication, Motivational Interviewing, and Solution-Oriented/Positive Psychology  Diagnosis: Generalized Anxiety Disorder, Major Depressive Disorder,  recurrent moderate, PTSD, HX of ADHD    Damien Junk MSW, LCSW/DATE 09/28/2023

## 2023-10-17 ENCOUNTER — Ambulatory Visit (INDEPENDENT_AMBULATORY_CARE_PROVIDER_SITE_OTHER): Payer: Self-pay | Admitting: Licensed Clinical Social Worker

## 2023-10-17 DIAGNOSIS — F4310 Post-traumatic stress disorder, unspecified: Secondary | ICD-10-CM

## 2023-10-17 DIAGNOSIS — F411 Generalized anxiety disorder: Secondary | ICD-10-CM

## 2023-10-17 DIAGNOSIS — Z8659 Personal history of other mental and behavioral disorders: Secondary | ICD-10-CM

## 2023-10-17 DIAGNOSIS — F331 Major depressive disorder, recurrent, moderate: Secondary | ICD-10-CM

## 2023-10-17 NOTE — Progress Notes (Signed)
 [facility] Counselor/Therapist Progress Note  Patient ID: Stephen Cooper, MRN: [id number]    Date: 10/17/23  Time Spent: 0320  pm - 0350 pm : 30 Minutes  Treatment Type: Individual Therapy.  Reported Symptoms: Patient reports that he had an event where he could not sleep for over 37 hours. He found that he decided to work on himself. He is now on medication, depression, anxiety, PTSD and ADHD are a few of the symptoms.    Mental Status Exam: Appearance:  Neat     Behavior: Appropriate  Motor: Normal  Speech/Language:  Clear and Coherent  Affect: Flat  Mood: Depressed  Thought process: normal  Thought content:   WNL  Sensory/Perceptual disturbances:   WNL  Orientation: oriented to person, place, time/date, situation, day of week, month of year, and year  Attention: Good  Concentration: Good  Memory: WNL  Fund of knowledge:  Good  Insight:   Good  Judgment:  Good  Impulse Control: Good    Risk Assessment: Danger to Self:  No Self-injurious Behavior: No Danger to Others: No Duty to Warn:no Physical Aggression / Violence:No  Access to Firearms a concern: No  Gang Involvement:No    Subjective:    Stephen Cooper participated in person from office, located at [employer]. Patient  consented to treatment. Therapist participated from office located at [facility].   Patient presented nearly 20 minutes late for his appointment. He reports that he was in the waiting room but he was not showing as arrived in Epic. Patient was irritable and reports that  he applied for benefits yesterday. He reports that he needs a letter stating that he can work at least part time. Patient reports that he has felt as though his family has taken advantage of him by asking him to assist with moving things from his grandmothers home. He reports that his father has given him the silent treatment because he has refused to help him outside. Patient remained irritable  throughout the time he remained in Clinicians office.  Clinician actively listened and provided encouragement and support via active listening. Clinician ask patient if he has attempted to get a psychiatrist to manage his medications. He stated March 03, 2024 is the earliest appointment he could get. Clinician encouraged patient to continue with therapy and to follow up with his PCP. Clinician encouraged patient to remain active and to assist his family as a way to show appreciation for their help. Patient states that it makes him angry because they think that way of it's the least he can do. Clinician observed that patient was irritated and wasn't in the mood to continue with session. Clinician provided patient with his letter and ask if he wanted to reschedule and he declined.   Patient was irritable and was anxious to get his requested letter and the session to be over. Patient was rude upon leaving the office and didn't even speak to Clinician as he left.   Interventions: Cognitive Behavioral Therapy and Assertiveness/Communication  Diagnosis: PTSD, HX of ADHD, Generalized Anxiety Disorder, Major Depressive Disorder, recurrent moderate.   Damien Junk MSW, LCSW/DATE 10/17/2023

## 2023-11-22 ENCOUNTER — Telehealth (HOSPITAL_COMMUNITY): Payer: Self-pay

## 2023-11-22 NOTE — Telephone Encounter (Signed)
 Pt's chart was accessed due to pt being on a backlogged list of patients needing assistance with PCP placement.  Per chart review, pt has since been seen by PCP. No further action taken at this time.

## 2023-12-22 ENCOUNTER — Other Ambulatory Visit: Payer: Self-pay | Admitting: Family Medicine

## 2023-12-22 DIAGNOSIS — F322 Major depressive disorder, single episode, severe without psychotic features: Secondary | ICD-10-CM

## 2023-12-22 DIAGNOSIS — G4709 Other insomnia: Secondary | ICD-10-CM

## 2023-12-25 ENCOUNTER — Ambulatory Visit (INDEPENDENT_AMBULATORY_CARE_PROVIDER_SITE_OTHER): Admitting: Family Medicine

## 2023-12-25 ENCOUNTER — Encounter: Payer: Self-pay | Admitting: Family Medicine

## 2023-12-25 VITALS — BP 136/88 | HR 65 | Temp 98.7°F | Ht 73.0 in | Wt 322.0 lb

## 2023-12-25 DIAGNOSIS — G4709 Other insomnia: Secondary | ICD-10-CM

## 2023-12-25 DIAGNOSIS — K625 Hemorrhage of anus and rectum: Secondary | ICD-10-CM

## 2023-12-25 DIAGNOSIS — G4733 Obstructive sleep apnea (adult) (pediatric): Secondary | ICD-10-CM

## 2023-12-25 DIAGNOSIS — F321 Major depressive disorder, single episode, moderate: Secondary | ICD-10-CM | POA: Diagnosis not present

## 2023-12-25 DIAGNOSIS — F411 Generalized anxiety disorder: Secondary | ICD-10-CM

## 2023-12-25 MED ORDER — ESCITALOPRAM OXALATE 20 MG PO TABS: 20.0000 mg | ORAL_TABLET | Freq: Every day | ORAL | 1 refills | Status: AC

## 2023-12-25 NOTE — Progress Notes (Signed)
 Patient Office Visit  Assessment & Plan:  Generalized anxiety disorder -     Escitalopram  Oxalate; Take 1 tablet (20 mg total) by mouth daily.  Dispense: 90 tablet; Refill: 1  Depression, major, single episode, moderate (HCC) -     Escitalopram  Oxalate; Take 1 tablet (20 mg total) by mouth daily.  Dispense: 90 tablet; Refill: 1  Other insomnia  Bright red blood per rectum  OSA (obstructive sleep apnea)   Assessment and Plan    Generalized anxiety disorder and depressive disorder Chronic anxiety exacerbated by job loss and financial stress. Depression well-managed with medication, anxiety remains challenging despite therapy. - Increase Lexapro  to 20 mg daily. - Continue Buspar  as needed for anxiety. - Continue therapy sessions.  Insomnia Chronic insomnia partially managed with trazodone . Reports difficulty sleeping, but trazodone  is helping. - Increase trazodone  to 75 mg at bedtime. - Monitor sleep patterns and response to increased dose.  Elevated blood pressure Intermittent elevation likely related to stress and anxiety. Prefers stress management before antihypertensive medication. - Monitor blood pressure and stress levels. - Reassess need for antihypertensive medication if stress management does not improve blood pressure.  Rectal bleeding Intermittent rectal bleeding associated with stress and constipation. Differential includes hemorrhoids versus other causes. - Monitor for recurrence of rectal bleeding. - Consider GI referral if symptoms persist or worsen.  Nicotine dependence, smokeless tobacco Chronic use increased during anxiety. Reports reduction in use but still uses more when anxious. - Encourage continued reduction in tobacco use. - Discuss potential cessation strategies in future visits.  Obstructive sleep apnea Managed with CPAP. Requires new CPAP supplies and possibly a new sleep study, deferred due to lack of insurance. - Coordinate with [facility] for  CPAP supplies. - Consider repeat sleep study when insurance is available.     Patient would like to to wait on a GI consult regarding intermittent rectal bleeding.  Patient will also like to hold off on starting blood pressure medication and continue to monitor it for now.  Increase to Lexapro  20 mg once a day.  Continue BuSpar  same dosage.  Increase the trazodone  to 75 mg at nighttime.  Patient has enough medications of trazodone  and BuSpar .  Patient will send us  a message via MyChart in the next 4 to 6 weeks to let us  know how he is doing with medication. Patient would not be able to do sleep study at this time due to lack of insurance. Hopefully he will be able to come in in person sometime next year when he gets new insurance.   Return in about 9 weeks (around 02/26/2024), or if symptoms worsen or fail to improve.   Subjective:    Patient ID: Stephen Cooper, male    DOB: [birth year]  Age: 38 y.o. MRN: [id number]  Chief Complaint  Patient presents with   Medical Management of Chronic Issues    HPI Discussed the use of AI scribe software for clinical note transcription with the patient, who gave verbal consent to proceed.  History of Present Illness       Stephen Cooper is a 38 year old male with depression/anxiety and insomnia who presents for medication management and stress-related symptoms. Patient does not have have insurance.   He experiences increased anxiety, particularly when stressed, which he attributes to living in his 'trauma space'. Despite making progress in managing his anxiety, he continues to struggle, especially after losing his job. The anxiety has been difficult to manage, impacting his daily life. He  is currently taking Lexapro  10 mg for anxiety and depression, which was effective until the recent job loss. He also takes Buspar  5 mg twice a day for anxiety and trazodone  for insomnia, which he finds helpful. Patient is actively seeking new employment.  Patient has psychiatry appt but not until January.   He reports experiencing intermittent rectal bleeding since 2012, which he associates with stress. The bleeding occurs when he is constipated and stressed, with the most recent episode occurring two days ago. No family history of colon problems. He describes himself as generally regular with bowel movements.  He uses a CPAP machine for sleep apnea, with settings at 14 pounds of oxygen. He recently broke a part of the machine and is seeking assistance for replacement supplies. He is open to a new sleep study but is currently uninsured.  Socially, he has been more active, going for walks and cooking at home. He is actively looking for part-time work after losing his full-time job. He uses tobacco, specifically dipping, more when anxious but notes a decrease in overall use.  Physical Exam CHEST: Lungs clear to auscultation bilaterally.  Assessment and Plan Generalized anxiety disorder and depressive disorder Chronic anxiety exacerbated by job loss and financial stress. Depression well-managed with medication, anxiety remains challenging despite therapy. - Increase Lexapro  to 20 mg daily. - Continue Buspar  as needed for anxiety. - Continue therapy sessions.  Insomnia Chronic insomnia partially managed with trazodone . Reports difficulty sleeping, but trazodone  is helping. - Increase trazodone  to 75 mg at bedtime. - Monitor sleep patterns and response to increased dose.  Elevated blood pressure Intermittent elevation likely related to stress and anxiety. Prefers stress management before antihypertensive medication. - Monitor blood pressure and stress levels. - Reassess need for antihypertensive medication if stress management does not improve blood pressure.  Rectal bleeding Intermittent rectal bleeding associated with stress and constipation. Differential includes hemorrhoids versus other causes. - Monitor for recurrence of rectal  bleeding. - Consider GI referral if symptoms persist or worsen.  Nicotine dependence, smokeless tobacco Chronic use increased during anxiety. Reports reduction in use but still uses more when anxious. - Encourage continued reduction in tobacco use. - Discuss potential cessation strategies in future visits.  Obstructive sleep apnea Managed with CPAP. Requires new CPAP supplies and possibly a new sleep study, deferred due to lack of insurance. - Coordinate with [facility] for CPAP supplies. - Consider repeat sleep study when insurance is available.    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   Social History   Tobacco Use   Smoking status: Former   Smokeless tobacco: Current    Types: Snuff   Tobacco comments:    pt smokes Hookah once a week  Vaping Use   Vaping status: Never Used  Substance Use Topics   Alcohol use: Not Currently    Comment: occasional liquor   Drug use: No   Family History  Problem Relation Age of Onset   Depression Mother    Dementia Maternal Grandfather    Anuerysm Paternal Aunt    Diabetes  Mellitus II Maternal Aunt    Coronary artery disease Neg Hx    Premature CHD Neg Hx    No Known Allergies  ROS    Objective:    BP 136/88   Pulse 65   Temp 98.7 F (37.1 C)   Ht 6' 1 (1.854 m)   Wt (!) 322 lb (146.1 kg)   SpO2 99%   BMI 42.48 kg/m  BP Readings from Last 3 Encounters:  12/25/23 136/88  07/31/23 124/82  06/23/23 124/78   Wt Readings from Last 3 Encounters:  12/25/23 (!) 322 lb (146.1 kg)  07/31/23 (!) 322 lb (146.1 kg)  06/23/23 (!) 330 lb 4 oz (149.8 kg)    Physical Exam Vitals and nursing  note reviewed.  Constitutional:      General: He is not in acute distress.    Appearance: Normal appearance.  HENT:     Head: Normocephalic.     Right Ear: Tympanic membrane, ear canal and external ear normal.     Left Ear: Tympanic membrane, ear canal and external ear normal.  Eyes:     Extraocular Movements: Extraocular movements intact.     Conjunctiva/sclera: Conjunctivae normal.     Pupils: Pupils are equal, round, and reactive to light.  Cardiovascular:     Rate and Rhythm: Normal rate and regular rhythm.     Heart sounds: Normal heart sounds.  Pulmonary:     Effort: Pulmonary effort is normal.     Breath sounds: Normal breath sounds.  Musculoskeletal:     Right lower leg: No edema.     Left lower leg: No edema.  Neurological:     General: No focal deficit present.     Mental Status: He is alert and oriented to person, place, and time.  Psychiatric:        Mood and Affect: Mood normal.        Behavior: Behavior normal.        Thought Content: Thought content normal.        Judgment: Judgment normal.      No results found for any visits on 12/25/23.

## 2023-12-26 ENCOUNTER — Other Ambulatory Visit: Payer: Self-pay

## 2023-12-26 DIAGNOSIS — G4733 Obstructive sleep apnea (adult) (pediatric): Secondary | ICD-10-CM

## 2024-01-09 ENCOUNTER — Other Ambulatory Visit

## 2024-01-09 DIAGNOSIS — N289 Disorder of kidney and ureter, unspecified: Secondary | ICD-10-CM

## 2024-01-10 ENCOUNTER — Ambulatory Visit: Payer: Self-pay | Admitting: Family Medicine

## 2024-01-10 LAB — BASIC METABOLIC PANEL WITH GFR
BUN/Creatinine Ratio: 7 (calc) (ref 6–22)
BUN: 10 mg/dL (ref 7–25)
CO2: 28 mmol/L (ref 20–32)
Calcium: 8.5 mg/dL — ABNORMAL LOW (ref 8.6–10.3)
Chloride: 106 mmol/L (ref 98–110)
Creat: 1.36 mg/dL — ABNORMAL HIGH (ref 0.60–1.26)
Glucose, Bld: 99 mg/dL (ref 65–99)
Potassium: 4.2 mmol/L (ref 3.5–5.3)
Sodium: 141 mmol/L (ref 135–146)
eGFR: 69 mL/min/1.73m2 (ref >=60)

## 2024-01-23 ENCOUNTER — Other Ambulatory Visit

## 2024-01-23 ENCOUNTER — Ambulatory Visit: Admitting: Licensed Clinical Social Worker

## 2024-01-23 DIAGNOSIS — N289 Disorder of kidney and ureter, unspecified: Secondary | ICD-10-CM

## 2024-01-23 LAB — BASIC METABOLIC PANEL WITH GFR
BUN/Creatinine Ratio: 9 (calc) (ref 6–22)
BUN: 12 mg/dL (ref 7–25)
CO2: 30 mmol/L (ref 20–32)
Calcium: 9.1 mg/dL (ref 8.6–10.3)
Chloride: 101 mmol/L (ref 98–110)
Creat: 1.3 mg/dL — ABNORMAL HIGH (ref 0.60–1.26)
Glucose, Bld: 84 mg/dL (ref 65–99)
Potassium: 4 mmol/L (ref 3.5–5.3)
Sodium: 139 mmol/L (ref 135–146)
eGFR: 73 mL/min/1.73m2

## 2024-01-24 ENCOUNTER — Ambulatory Visit: Payer: Self-pay | Admitting: Family Medicine

## 2024-01-29 ENCOUNTER — Other Ambulatory Visit: Payer: Self-pay

## 2024-01-29 DIAGNOSIS — N289 Disorder of kidney and ureter, unspecified: Secondary | ICD-10-CM

## 2024-02-05 ENCOUNTER — Institutional Professional Consult (permissible substitution): Admitting: Neurology

## 2024-02-06 ENCOUNTER — Other Ambulatory Visit: Payer: Self-pay

## 2024-02-06 DIAGNOSIS — F411 Generalized anxiety disorder: Secondary | ICD-10-CM

## 2024-02-06 MED ORDER — BUSPIRONE HCL 5 MG PO TABS: 5.0000 mg | ORAL_TABLET | Freq: Two times a day (BID) | ORAL | 1 refills | Status: AC

## 2024-02-26 ENCOUNTER — Ambulatory Visit (HOSPITAL_COMMUNITY): Admitting: Registered Nurse

## 2024-02-26 ENCOUNTER — Encounter (HOSPITAL_COMMUNITY): Payer: Self-pay

## 2024-02-27 ENCOUNTER — Ambulatory Visit (INDEPENDENT_AMBULATORY_CARE_PROVIDER_SITE_OTHER): Admitting: Licensed Clinical Social Worker

## 2024-02-27 DIAGNOSIS — F411 Generalized anxiety disorder: Secondary | ICD-10-CM

## 2024-02-27 DIAGNOSIS — Z8659 Personal history of other mental and behavioral disorders: Secondary | ICD-10-CM | POA: Diagnosis not present

## 2024-02-27 DIAGNOSIS — F4310 Post-traumatic stress disorder, unspecified: Secondary | ICD-10-CM | POA: Diagnosis not present

## 2024-02-27 DIAGNOSIS — F331 Major depressive disorder, recurrent, moderate: Secondary | ICD-10-CM
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
## 2024-03-07 ENCOUNTER — Encounter: Payer: Self-pay | Admitting: *Deleted

## 2024-03-12 ENCOUNTER — Encounter: Payer: Self-pay | Admitting: Neurology

## 2024-03-12 ENCOUNTER — Ambulatory Visit: Admitting: Neurology

## 2024-03-12 ENCOUNTER — Telehealth: Payer: Self-pay | Admitting: Neurology

## 2024-03-12 VITALS — BP 121/79 | HR 63 | Ht 73.0 in | Wt 327.2 lb

## 2024-03-12 DIAGNOSIS — G4733 Obstructive sleep apnea (adult) (pediatric): Secondary | ICD-10-CM

## 2024-03-12 DIAGNOSIS — R351 Nocturia: Secondary | ICD-10-CM

## 2024-03-12 DIAGNOSIS — R634 Abnormal weight loss: Secondary | ICD-10-CM | POA: Diagnosis not present

## 2024-03-12 DIAGNOSIS — Z820 Family history of epilepsy and other diseases of the nervous system: Secondary | ICD-10-CM

## 2024-03-12 NOTE — Patient Instructions (Signed)
 Please continue using your CPAP regularly. While your insurance requires that you use CPAP at least 4 hours each night on 70% of the nights, I recommend, that you not skip any nights and use it throughout the night if you can. Getting used to CPAP and staying with the treatment long term does take time and patience and discipline. Untreated obstructive sleep apnea when it is moderate to severe can have an adverse impact on cardiovascular health and raise her risk for heart disease, arrhythmias, hypertension, congestive heart failure, stroke and diabetes. Untreated obstructive sleep apnea causes sleep disruption, nonrestorative sleep, and sleep deprivation. This can have an impact on your day to day functioning and cause daytime sleepiness and impairment of cognitive function, memory loss, mood disturbance, and problems focussing. Using CPAP regularly can improve these symptoms. We can see you in 1 year, you can see one of our nurse practitioners.  We may consider a repeat home sleep test at that time to get you qualified for a new machine.  Especially in light of your weight loss it would be good to reevaluate you.  I have placed an order for supplies.

## 2024-03-12 NOTE — Telephone Encounter (Signed)
 Previously was established with [facility] for his CPAP DME, please send supply order to [facility] to reestablish with them.

## 2024-03-12 NOTE — Telephone Encounter (Signed)
 Sent DME order to [facility] this afternoon

## 2024-03-12 NOTE — Progress Notes (Signed)
 Subjective:    Patient ID: Stephen Cooper is a 39 y.o. male.  HPI    True Mar, MD, PhD [facility] [address], [address] [address] [place], [place] [address]  Dear Dr. Aletha,  I saw your patient, Stephen Cooper, upon your kind request, in my sleep clinic today for initial consultation of his sleep disorder, in particular, evaluation of his prior diagnosis of obstructive sleep apnea.  The patient is unaccompanied today.  As you know, Stephen Cooper is a 39 year old male with an underlying medical history of hypertension, asthma, depression, anxiety, and severe obesity with a BMI of over 40, who reports overall doing well with his CPAP.  He has not been able to get supplies in the recent past and has ordered supplies online typically.  He uses a medium N20 nasal mask from [employer] with good tolerance.  He has not had any trouble with an error message on the machine.  He has been working on weight loss.  He has not seen a sleep specialist in years.  His Epworth sleepiness score is 2 out of 24, fatigue severity score is 47 out of 63.  I reviewed your office note from 12/25/2023. He is followed by behavioral health.  He is on trazodone  at bedtime. He has chronic difficulty initiating and maintaining sleep and has been working on his sleep hygiene.  He does smoke THC daily and is working on nicotine/tobacco cessation.  He is not a smoker of cigarettes.  He does not drink alcohol on a regular basis, typically only a few times a year.  He drinks caffeine in the form of diet soda, about a liter per day.  Bedtime is generally around 1:30 AM and rise time around 11:30 AM.  He does not work currently.  He lives with his parents.  He had a tonsillectomy as a child.  He has not used his DME supplier in years, previously was established with [employer].  He would be interested in reestablishing with them.  His mother has sleep apnea. He had a home sleep test through [facility] pulmonology on  09/29/2016 and I reviewed the report.  He was diagnosed with moderate obstructive sleep apnea with an AHI of 26.6/h, O2 nadir was 59%.  He was recommended for a CPAP titration study.  He had a subsequent PAP titration study on 11/01/2016 and I reviewed the report.  He was titrated from 5 cm to 11 cm. Study was interpreted by Dr. Harden Staff.  He was advised to start home CPAP of 11 cm via small nasal mask from [employer]. I reviewed his PAP compliance data from the past 30 and 90 days.  In the past 90 days he used his machine 81 days with percent use days greater than 4 hours at 71% with an average usage for days on treatment of 6 hours and 9 minutes.  Residual AHI at goal at 1.2/h, pressure of 11 cm with EPR of 3, leak acceptable with the 95th percentile at 13.4 L/min with fluctuation noted.  In the past 30 days his compliance has been a little bit less.  He is working through some behavioral issues he states.  He is very motivated to continue with treatment. Of note, he has had interim weight loss.  At the time of his home sleep test in August 2018 he weighed 382 pounds for a BMI of 50.4.  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    His Family History Is Significant For: Family History  Problem Relation Age of Onset   Depression Mother    Sleep apnea Mother    Diabetes Mellitus II Maternal Aunt    Anuerysm Paternal Aunt    Dementia Maternal Grandfather    Coronary artery disease Neg Hx    Premature CHD Neg Hx     His Social History Is Significant For: Social History   Socioeconomic History   Marital status: Single    Spouse name: Not on file   Number of children: Not on file   Years of education: Not on file    Highest education level: Not on file  Occupational History   Not on file  Tobacco Use   Smoking status: Former   Smokeless tobacco: Current    Types: Snuff   Tobacco comments:    pt smokes Hookah once a week  Vaping Use   Vaping status: Some Days  Substance and Sexual Activity   Alcohol use: Not Currently    Comment: occasional liquor   Drug use: Yes    Types: Marijuana   Sexual activity: Not on file  Other Topics Concern   Not on file  Social History Narrative   Smoking marijuana on weekends.    Pt doesn't work    Pt lives with roommates    Social Drivers of Health   Tobacco Use: High Risk (03/12/2024)   Patient History    Smoking Tobacco Use: Former    Smokeless Tobacco Use: Current    Passive Exposure: Not on Actuary Strain: Not on file  Food Insecurity: Not on file  Transportation Needs: Not on file  Physical Activity: Not on file  Stress: Not on file  Social Connections: Not on file  Depression (PHQ2-9): High Risk (07/31/2023)   Depression (PHQ2-9)    PHQ-2 Score: 14  Alcohol Screen: Not on file  Housing: Not on file  Utilities: Not on file  Health Literacy: Not on file    His Allergies Are:  Allergies[1]:   His Current Medications Are:  Outpatient Encounter Medications as of 03/12/2024  Medication Sig   busPIRone  (BUSPAR ) 5 MG tablet Take 1 tablet (5 mg total) by mouth 2 (two) times daily.   escitalopram  (LEXAPRO ) 20 MG tablet Take 1 tablet (20 mg total) by mouth daily.   traZODone  (DESYREL ) 50 MG tablet TAKE 1 TABLET(50 MG) BY MOUTH AT BEDTIME   escitalopram  (LEXAPRO ) 10 MG tablet TAKE 1 TABLET(10 MG) BY MOUTH DAILY   No facility-administered encounter medications on file as of 03/12/2024.  :   Review of Systems:  Out of a complete 14 point review of systems, all are reviewed and negative with the exception of these symptoms as listed below:  Review of Systems  Objective:  Neurological Exam  Physical Exam Physical Examination:    Vitals:   03/12/24 1517  BP: 121/79  Pulse: 63    General Examination: The patient is a very pleasant 39 y.o. male in no acute distress.   HEENT: Normocephalic, atraumatic, pupils are equal, round and reactive to light, extraocular tracking is good without limitation to gaze excursion or nystagmus noted. No photophobia. Corrective eye glasses in place. Hearing  is grossly intact.  Face is symmetric with normal facial animation. Speech is clear without dysarthria. There is no hypophonia. There is no lip, neck/head, jaw or voice tremor. Neck is supple with full range of passive and active motion. There are no carotid bruits on auscultation.  Airway/Oropharynx exam reveals: mild mouth dryness, adequate dental hygiene and moderate airway crowding, due to small airway entry, tonsils absent, prominent uvula noted.  Neck circumference 19-1/2 inches.  Tongue protrudes centrally and palate elevates symmetrically.  Chest: Clear to auscultation without wheezing, rhonchi or crackles noted.  Heart: S1+S2+0, regular and normal without murmurs, rubs or gallops noted.   Abdomen: Soft, non-tender and non-distended.  Extremities: There is no obvious swelling pitting edema in the distal lower extremities bilaterally.   Skin: Warm and dry without trophic changes noted.   Musculoskeletal: exam reveals no obvious joint deformities.   Neurologically:  Mental status: The patient is awake, alert and oriented in all 4 spheres. His immediate and remote memory, attention, language skills and fund of knowledge are appropriate. There is no evidence of aphasia, agnosia, apraxia or anomia. Speech is clear with normal prosody and enunciation. Thought process is linear. Mood is normal and affect is normal.  Cranial nerves II - XII are as described above under HEENT exam.  Motor exam: Normal bulk, moving all 4 extremities without any obvious restriction, no obvious action or resting tremor.  Fine motor skills and  coordination: Intact grossly.  Cerebellar testing: No dysmetria or intention tremor. There is no truncal or gait ataxia.  Sensory exam: intact to light touch in the upper and lower extremities.  Gait, station and balance: He stands easily. No veering to one side is noted. No leaning to one side is noted. Posture is age-appropriate and stance is narrow based. Gait shows normal stride length and normal pace. No problems turning are noted.   Assessment and Plan:   In summary, DAKARI CREGGER is a 39 year old male with an underlying medical history of hypertension, asthma, depression, anxiety, and severe obesity with a BMI of over 40, who presents for evaluation of his obstructive sleep apnea.  He was diagnosed with moderate obstructive sleep apnea in 2018, he had severe desaturations at the time.  He had a CPAP titration study in September 2018 and has been on PAP therapy for years.  He is compliant with treatment.  He has worked on weight loss and has achieved quite a bit of weight loss in the past 5 years.  He is commended for his treatment adherence and weight loss endeavors.  We talked about sleep apnea treatment and the importance of staying compliant with PAP therapy.  We mutually agreed not to proceed with sleep testing for reevaluation quite yet especially since he has a working machine without any obvious difficulty with the current machine.  Set up date according to online records was 01/23/2019.  We talked about the importance of maintaining a healthy lifestyle and good sleep hygiene.  THC cessation and tobacco cessation were encouraged.  He is advised to follow-up routinely in this clinic to see one of our nurse practitioners in 1 year.  I placed an order for CPAP supplies and we will send the order to his previous DME company, [employer].   We may consider home sleep test next year to qualify him for a new machine and to reassess his sleep apnea severity in light of his significant weight loss. I  answered all his questions today and he was in agreement  with our plan. Thank you very much for allowing me to participate in the care of this nice patient. If I can be of any further assistance to you please do not hesitate to call me at [phone].  Sincerely,   True Mar, MD, PhD     [1] No Known Allergies

## 2024-03-13 ENCOUNTER — Ambulatory Visit: Admitting: Licensed Clinical Social Worker

## 2024-03-13 NOTE — Telephone Encounter (Signed)
DME order faxed to [employer].  

## 2024-03-15 NOTE — Telephone Encounter (Signed)
 Ky from [facility] has called to report that the order for CPAP supplies will be cx as they are unable to contact pt, no telephone # to provide for pt.

## 2024-03-19 ENCOUNTER — Ambulatory Visit: Admitting: Licensed Clinical Social Worker

## 2024-03-19 NOTE — Progress Notes (Unsigned)
 [facility] Counselor/Therapist Progress Note  Patient ID: Stephen Cooper, MRN: [id number]    Date: 03/19/24  Time Spent: ***  {LBBHAMPM:26719} - *** {LBBHAMPM:26719} : *** Minutes  Treatment Type: Individual Therapy.  Reported Symptoms: ***  Mental Status Exam: Appearance:  {PSY:22683}     Behavior: {PSY:21022743}  Motor: {PSY:22302}  Speech/Language:  {PSY:22685}  Affect: {PSY:22687}  Mood: {PSY:31886}  Thought process: {PSY:31888}  Thought content:   {PSY:872-806-3033}  Sensory/Perceptual disturbances:   {PSY:828-187-6856}  Orientation: {PSY:30297}  Attention: {PSY:22877}  Concentration: {PSY:9394121948}  Memory: {[phone]}  Fund of knowledge:  {PSY:9394121948}  Insight:   {PSY:9394121948}  Judgment:  {PSY:9394121948}  Impulse Control: {PSY:9394121948}   Risk Assessment: Danger to Self:  {PSY:22692} Self-injurious Behavior: {PSY:22692} Danger to Others: {PSY:22692} Duty to Warn:{PSY:311194} Physical Aggression / Violence:{PSY:21197} Access to Firearms a concern: {PSY:21197} Gang Involvement:{PSY:21197}  Subjective:   Stephen Cooper participated from {Patient Location:[address]::home}, via {LBBHVIDEOORPHONE:26720}, and consented to treatment. Therapist participated from {LBBHPROVIDERLOCATION:26721}. We met online due to COVID pandemic.   ***   Interventions: {PSY:737-554-4672}  Diagnosis: No diagnosis found.   Plan: ***Patient is to use CBT, mindfulness and coping skills to help manage decrease symptoms associated with their diagnosis.   Long-term goal:   ***Reduce overall level, frequency, and intensity of the feelings of depression, anxiety and panic evidenced by       decreased irritability, negative self talk, and helpless feelings from 6 to 7 days/week to 0 to 1 days/week per client report for at least 3 consecutive months.  Short-term goal:  ***Verbally express understanding of the relationship between feelings of depression, anxiety  and their impact on thinking patterns and behaviors. Verbalize an understanding of the role that distorted thinking plays in creating fears, excessive worry, and ruminations.  Damien Junk MSW, LCSW/DATE

## 2024-03-28 ENCOUNTER — Ambulatory Visit: Admitting: Licensed Clinical Social Worker

## 2024-03-28 NOTE — Progress Notes (Unsigned)
 [facility] Counselor/Therapist Progress Note  Patient ID: Stephen Cooper, MRN: [id number]    Date: 03/28/24  Time Spent: ***  {LBBHAMPM:26719} - *** {LBBHAMPM:26719} : *** Minutes  Treatment Type: Individual Therapy.  Reported Symptoms: ***  Mental Status Exam: Appearance:  {PSY:22683}     Behavior: {PSY:21022743}  Motor: {PSY:22302}  Speech/Language:  {PSY:22685}  Affect: {PSY:22687}  Mood: {PSY:31886}  Thought process: {PSY:31888}  Thought content:   {[phone]}  Sensory/Perceptual disturbances:   {PSY:[phone]}  Orientation: {PSY:30297}  Attention: {PSY:22877}  Concentration: {PSY:5022282882}  Memory: {[phone]}  Fund of knowledge:  {PSY:5022282882}  Insight:   {PSY:5022282882}  Judgment:  {PSY:5022282882}  Impulse Control: {PSY:5022282882}   Risk Assessment: Danger to Self:  {PSY:22692} Self-injurious Behavior: {PSY:22692} Danger to Others: {PSY:22692} Duty to Warn:{PSY:311194} Physical Aggression / Violence:{PSY:21197} Access to Firearms a concern: {PSY:21197} Gang Involvement:{PSY:21197}  Subjective:   Stephen Cooper participated from {Patient Location:[address]::home}, via {LBBHVIDEOORPHONE:26720}, and consented to treatment. Therapist participated from {LBBHPROVIDERLOCATION:26721}. We met online due to COVID pandemic.   ***   Interventions: {PSY:339-815-6657}  Diagnosis: No diagnosis found.   Plan: ***Patient is to use CBT, mindfulness and coping skills to help manage decrease symptoms associated with their diagnosis.   Long-term goal:   ***Reduce overall level, frequency, and intensity of the feelings of depression, anxiety and panic evidenced by       decreased irritability, negative self talk, and helpless feelings from 6 to 7 days/week to 0 to 1 days/week per client report for at least 3 consecutive months.  Short-term goal:  ***Verbally express understanding of the relationship between feelings of depression, anxiety  and their impact on thinking patterns and behaviors. Verbalize an understanding of the role that distorted thinking plays in creating fears, excessive worry, and ruminations.  Damien Junk MSW, LCSW/DATE

## 2024-04-10 ENCOUNTER — Ambulatory Visit: Admitting: Licensed Clinical Social Worker

## 2024-09-02 ENCOUNTER — Ambulatory Visit (HOSPITAL_COMMUNITY): Admitting: Registered Nurse

## 2025-03-17 ENCOUNTER — Ambulatory Visit: Admitting: Adult Health
